# Patient Record
Sex: Female | Born: 1965 | Hispanic: No | Marital: Married | Smoking: Never smoker
Health system: Southern US, Community
[De-identification: ages and names within clinical notes are randomized; demographics above are authoritative.]

## PROBLEM LIST (undated history)

## (undated) DIAGNOSIS — I10 Essential (primary) hypertension: Secondary | ICD-10-CM

## (undated) DIAGNOSIS — N840 Polyp of corpus uteri: Secondary | ICD-10-CM

## (undated) HISTORY — PX: NO PAST SURGERIES: SHX2092

## (undated) HISTORY — PX: WISDOM TOOTH EXTRACTION: SHX21

---

## 2004-08-19 ENCOUNTER — Other Ambulatory Visit: Admission: RE | Admit: 2004-08-19 | Discharge: 2004-08-19 | Payer: Self-pay | Admitting: Addiction Medicine

## 2005-09-06 ENCOUNTER — Other Ambulatory Visit: Admission: RE | Admit: 2005-09-06 | Discharge: 2005-09-06 | Payer: Self-pay | Admitting: Obstetrics and Gynecology

## 2005-09-15 ENCOUNTER — Encounter: Admission: RE | Admit: 2005-09-15 | Discharge: 2005-09-15 | Payer: Self-pay | Admitting: Obstetrics and Gynecology

## 2006-10-31 ENCOUNTER — Other Ambulatory Visit: Admission: RE | Admit: 2006-10-31 | Discharge: 2006-10-31 | Payer: Self-pay | Admitting: Obstetrics and Gynecology

## 2007-06-05 ENCOUNTER — Encounter: Admission: RE | Admit: 2007-06-05 | Discharge: 2007-06-05 | Payer: Self-pay | Admitting: Obstetrics and Gynecology

## 2007-06-14 ENCOUNTER — Encounter: Admission: RE | Admit: 2007-06-14 | Discharge: 2007-06-14 | Payer: Self-pay | Admitting: Obstetrics and Gynecology

## 2007-12-09 ENCOUNTER — Other Ambulatory Visit: Admission: RE | Admit: 2007-12-09 | Discharge: 2007-12-09 | Payer: Self-pay | Admitting: Obstetrics and Gynecology

## 2008-07-22 ENCOUNTER — Encounter: Admission: RE | Admit: 2008-07-22 | Discharge: 2008-07-22 | Payer: Self-pay | Admitting: Obstetrics and Gynecology

## 2009-01-08 ENCOUNTER — Ambulatory Visit: Payer: Self-pay | Admitting: Women's Health

## 2009-01-08 ENCOUNTER — Other Ambulatory Visit: Admission: RE | Admit: 2009-01-08 | Discharge: 2009-01-08 | Payer: Self-pay | Admitting: Addiction Medicine

## 2009-01-08 ENCOUNTER — Encounter: Payer: Self-pay | Admitting: Women's Health

## 2009-08-06 ENCOUNTER — Encounter: Admission: RE | Admit: 2009-08-06 | Discharge: 2009-08-06 | Payer: Self-pay | Admitting: Obstetrics and Gynecology

## 2010-03-31 ENCOUNTER — Ambulatory Visit: Payer: Self-pay | Admitting: Women's Health

## 2010-03-31 ENCOUNTER — Other Ambulatory Visit
Admission: RE | Admit: 2010-03-31 | Discharge: 2010-03-31 | Payer: Self-pay | Source: Home / Self Care | Admitting: Obstetrics and Gynecology

## 2010-05-22 ENCOUNTER — Encounter: Payer: Self-pay | Admitting: Family Medicine

## 2010-07-21 ENCOUNTER — Other Ambulatory Visit: Payer: Self-pay | Admitting: Obstetrics and Gynecology

## 2010-07-21 DIAGNOSIS — Z1231 Encounter for screening mammogram for malignant neoplasm of breast: Secondary | ICD-10-CM

## 2010-08-22 ENCOUNTER — Ambulatory Visit
Admission: RE | Admit: 2010-08-22 | Discharge: 2010-08-22 | Disposition: A | Discharge status: Home / Self Care | Payer: BC Managed Care – PPO | Source: Ambulatory Visit | Attending: Obstetrics and Gynecology | Admitting: Obstetrics and Gynecology

## 2010-08-22 DIAGNOSIS — Z1231 Encounter for screening mammogram for malignant neoplasm of breast: Secondary | ICD-10-CM

## 2011-04-15 ENCOUNTER — Other Ambulatory Visit: Payer: Self-pay | Admitting: Women's Health

## 2011-05-13 ENCOUNTER — Other Ambulatory Visit: Payer: Self-pay | Admitting: Women's Health

## 2011-06-01 ENCOUNTER — Encounter: Payer: BC Managed Care – PPO | Admitting: Women's Health

## 2011-06-14 ENCOUNTER — Other Ambulatory Visit: Payer: Self-pay | Admitting: Women's Health

## 2011-06-22 ENCOUNTER — Encounter: Payer: Self-pay | Admitting: Women's Health

## 2011-06-22 ENCOUNTER — Ambulatory Visit (INDEPENDENT_AMBULATORY_CARE_PROVIDER_SITE_OTHER): Payer: Managed Care, Other (non HMO) | Admitting: Women's Health

## 2011-06-22 VITALS — BP 120/80 | Ht 65.0 in | Wt 180.0 lb

## 2011-06-22 DIAGNOSIS — Z01419 Encounter for gynecological examination (general) (routine) without abnormal findings: Secondary | ICD-10-CM

## 2011-06-22 DIAGNOSIS — Z309 Encounter for contraceptive management, unspecified: Secondary | ICD-10-CM

## 2011-06-22 DIAGNOSIS — IMO0001 Reserved for inherently not codable concepts without codable children: Secondary | ICD-10-CM

## 2011-06-22 DIAGNOSIS — Z13220 Encounter for screening for lipoid disorders: Secondary | ICD-10-CM

## 2011-06-22 LAB — CBC WITH DIFFERENTIAL/PLATELET
Basophils Absolute: 0 10*3/uL (ref 0.0–0.1)
Basophils Relative: 1 % (ref 0–1)
Lymphocytes Relative: 28 % (ref 12–46)
MCV: 93 fL (ref 78.0–100.0)
Monocytes Absolute: 0.4 10*3/uL (ref 0.1–1.0)
Neutro Abs: 4.6 10*3/uL (ref 1.7–7.7)
Neutrophils Relative %: 65 % (ref 43–77)
Platelets: 247 10*3/uL (ref 150–400)
WBC: 7.1 10*3/uL (ref 4.0–10.5)

## 2011-06-22 LAB — LIPID PANEL
Cholesterol: 176 mg/dL (ref 0–200)
HDL: 51 mg/dL (ref >39)
LDL Cholesterol: 96 mg/dL (ref 0–99)
Triglycerides: 147 mg/dL (ref <150)

## 2011-06-22 MED ORDER — NORETHIN ACE-ETH ESTRAD-FE 1-20 MG-MCG PO TABS: 1.0000 | ORAL_TABLET | Freq: Every day | ORAL | Status: DC

## 2011-06-22 NOTE — Progress Notes (Signed)
Melanie Wiggins 1965/06/10 [id number]    History:    The patient presents for annual exam.  Monthly 5 day cycle on tri-Sprintec with complaint of increased dysmenorrhea over the past year or 2. Has occasional night sweats, not daily. History of all normal Paps and mammograms. History of GDM with first pregnancy.  Past medical history, past surgical history, family history and social history were all reviewed and documented in the EPIC chart. Daughter Nehemiah Settle diagnosed with type 1 diabetes last year at age 74, doing well. States has had normal blood sugars when checked at home. 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   ROS:  A  ROS was performed and pertinent positives and negatives are included in the history.  Exam:  Filed Vitals:   06/22/11 0943  BP: 120/80    General appearance:  Normal Head/Neck:  Normal, without cervical or supraclavicular adenopathy. Thyroid:  Symmetrical, normal in size, without palpable masses or nodularity. Respiratory  Effort:  Normal  Auscultation:  Clear without wheezing or rhonchi Cardiovascular  Auscultation:  Regular rate, without rubs, murmurs or gallops  Edema/varicosities:  Not grossly evident Abdominal  Soft,nontender, without masses, guarding or rebound.  Liver/spleen:  No organomegaly noted  Hernia:  None appreciated  Skin  Inspection:  Grossly normal  Palpation:  Grossly normal Neurologic/psychiatric  Orientation:  Normal with appropriate conversation.  Mood/affect:  Normal  Genitourinary    Breasts: Examined lying and sitting.     Right: Without masses, retractions, discharge or axillary adenopathy.     Left: Without masses, retractions, discharge or axillary adenopathy.   Inguinal/mons:  Normal without inguinal adenopathy  External genitalia:  Normal  BUS/Urethra/Skene's glands:  Normal  Bladder:  Normal  Vagina:  Normal  Cervix:  Normal  Uterus:   normal in size, shape and contour.  Midline and mobile  Adnexa/parametria:      Rt: Without masses or tenderness.   Lt: Without masses or tenderness.  Anus and perineum: Normal  Digital rectal exam: Normal sphincter tone without palpated masses or tenderness  Assessment/Plan:  46 y.o. MWF G6P3 for annual exam.     Tri-Sprintec with dysmenorrhea and occasional hot flashes  Plan: Loestrin 1/20 prescription, proper use, slight risk for blood clots and strokes reviewed. Instructed to call if continued problems with dysmenorrhea. Motrin when necessary for dysmenorrhea. SBE's, continue annual mammogram, exercise, calcium rich diet, vitamin D encouraged. CBC, lipid profile, UA. Normal blood glucose at home.   Harrington Challenger [facility], 10:56 AM 06/22/2011

## 2011-06-23 LAB — URINALYSIS W MICROSCOPIC + REFLEX CULTURE
Bacteria, UA: NONE SEEN
Glucose, UA: NEGATIVE mg/dL
Hgb urine dipstick: NEGATIVE
Nitrite: NEGATIVE
Squamous Epithelial / LPF: NONE SEEN
Urobilinogen, UA: 0.2 mg/dL (ref 0.0–1.0)

## 2011-09-18 ENCOUNTER — Telehealth: Payer: Self-pay | Admitting: *Deleted

## 2011-09-18 NOTE — Telephone Encounter (Signed)
Pt is currently taking Loestrin 1/20 since feb 2013 to help with menstrual cramping, before pt was  taking tri-sprintec, but stopped because she started to having cramping with this. Pt is c/o spotting while taking Loestrin, she admits that she does not take pills on time everyday. She would like to switch to something else if possible, pt said she is sick of the spotting every day. Call back # [phone] please advise

## 2011-09-19 NOTE — Telephone Encounter (Signed)
Message left

## 2011-09-20 ENCOUNTER — Other Ambulatory Visit: Payer: Self-pay | Admitting: Women's Health

## 2011-09-20 DIAGNOSIS — IMO0001 Reserved for inherently not codable concepts without codable children: Secondary | ICD-10-CM

## 2011-09-20 MED ORDER — NORGESTREL-ETHINYL ESTRADIOL 0.3-30 MG-MCG PO TABS: 1.0000 | ORAL_TABLET | Freq: Every day | ORAL | Status: DC

## 2011-09-20 NOTE — Progress Notes (Signed)
Telephone call, problems with spotting on Loestrin, having difficulty taking it at the same time each day, did not have this problem on Ortho Tri-Cyclen. Was having increased cramping on  Ortho Tri-Cyclen, that has improved on Loestrin. Currently on placebo week, did take a pregnancy test that was negative. Will try lo ovral, instructed to call if continued spotting reviewed importance of taking daily. Instructed to start Lo/Ovral day 5 of placebo week. Reviewed Ortho Evra patch or nuva ring declines both. Same partner, denies vaginal discharge

## 2011-11-06 ENCOUNTER — Other Ambulatory Visit: Payer: Self-pay | Admitting: Obstetrics and Gynecology

## 2011-11-06 DIAGNOSIS — Z1231 Encounter for screening mammogram for malignant neoplasm of breast: Secondary | ICD-10-CM

## 2011-11-08 ENCOUNTER — Ambulatory Visit
Admission: RE | Admit: 2011-11-08 | Discharge: 2011-11-08 | Disposition: A | Discharge status: Home / Self Care | Payer: Managed Care, Other (non HMO) | Source: Ambulatory Visit | Attending: Obstetrics and Gynecology | Admitting: Obstetrics and Gynecology

## 2011-11-08 DIAGNOSIS — Z1231 Encounter for screening mammogram for malignant neoplasm of breast: Secondary | ICD-10-CM

## 2012-07-27 ENCOUNTER — Other Ambulatory Visit: Payer: Self-pay | Admitting: Women's Health

## 2012-09-16 ENCOUNTER — Encounter: Payer: Self-pay | Admitting: Women's Health

## 2012-09-25 ENCOUNTER — Other Ambulatory Visit: Payer: Self-pay | Admitting: Women's Health

## 2012-10-17 ENCOUNTER — Encounter: Payer: Self-pay | Admitting: Women's Health

## 2012-10-17 ENCOUNTER — Other Ambulatory Visit (HOSPITAL_COMMUNITY)
Admission: RE | Admit: 2012-10-17 | Discharge: 2012-10-17 | Disposition: A | Discharge status: Home / Self Care | Payer: Managed Care, Other (non HMO) | Source: Ambulatory Visit | Attending: Gynecology | Admitting: Gynecology

## 2012-10-17 ENCOUNTER — Ambulatory Visit (INDEPENDENT_AMBULATORY_CARE_PROVIDER_SITE_OTHER): Payer: Managed Care, Other (non HMO) | Admitting: Women's Health

## 2012-10-17 VITALS — BP 116/72 | Ht 65.0 in | Wt 185.0 lb

## 2012-10-17 DIAGNOSIS — Z309 Encounter for contraceptive management, unspecified: Secondary | ICD-10-CM

## 2012-10-17 DIAGNOSIS — Z01419 Encounter for gynecological examination (general) (routine) without abnormal findings: Secondary | ICD-10-CM | POA: Insufficient documentation

## 2012-10-17 DIAGNOSIS — IMO0001 Reserved for inherently not codable concepts without codable children: Secondary | ICD-10-CM

## 2012-10-17 DIAGNOSIS — E079 Disorder of thyroid, unspecified: Secondary | ICD-10-CM

## 2012-10-17 DIAGNOSIS — Z13220 Encounter for screening for lipoid disorders: Secondary | ICD-10-CM

## 2012-10-17 LAB — CBC WITH DIFFERENTIAL/PLATELET
Eosinophils Absolute: 0.1 10*3/uL (ref 0.0–0.7)
HCT: 42 % (ref 36.0–46.0)
Hemoglobin: 14.5 g/dL (ref 12.0–15.0)
MCV: 89.4 fL (ref 78.0–100.0)
Monocytes Absolute: 0.4 10*3/uL (ref 0.1–1.0)
Neutro Abs: 3.4 10*3/uL (ref 1.7–7.7)
RBC: 4.7 MIL/uL (ref 3.87–5.11)
WBC: 6 10*3/uL (ref 4.0–10.5)

## 2012-10-17 MED ORDER — NORGESTREL-ETHINYL ESTRADIOL 0.3-30 MG-MCG PO TABS: 1.0000 | ORAL_TABLET | Freq: Every day | ORAL | Status: DC

## 2012-10-17 NOTE — Progress Notes (Signed)
Melanie Wiggins 1965/11/12 [id number]    History:    The patient presents for annual exam.  Monthly cycle on Cryselle without complaint. History of normal Paps And mammograms.   Past medical history, past surgical history, family history and social history were all reviewed and documented in the EPIC chart. 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 Renato Gails, 23 at [facility], [place] senior in high school doing well. Parents hypertension. Gestational diabetes with one pregnancy.   ROS:  A  ROS was performed and pertinent positives and negatives are included in the history.  Exam:  Filed Vitals:   10/17/12 1430  BP: 116/72    General appearance:  Normal Head/Neck:  Normal, without cervical or supraclavicular adenopathy. Thyroid:  Symmetrical, normal in size, without palpable masses or nodularity. Respiratory  Effort:  Normal  Auscultation:  Clear without wheezing or rhonchi Cardiovascular  Auscultation:  Regular rate, without rubs, murmurs or gallops  Edema/varicosities:  Not grossly evident Abdominal  Soft,nontender, without masses, guarding or rebound.  Liver/spleen:  No organomegaly noted  Hernia:  None appreciated  Skin  Inspection:  Grossly normal  Palpation:  Grossly normal Neurologic/psychiatric  Orientation:  Normal with appropriate conversation.  Mood/affect:  Normal  Genitourinary    Breasts: Examined lying and sitting.     Right: Without masses, retractions, discharge or axillary adenopathy.     Left: Without masses, retractions, discharge or axillary adenopathy.   Inguinal/mons:  Normal without inguinal adenopathy  External genitalia:  Normal  BUS/Urethra/Skene's glands:  Normal  Bladder:  Normal  Vagina:  Normal  Cervix:  Normal  Uterus:   normal in size, shape and contour.  Midline and mobile  Adnexa/parametria:     Rt: Without masses or tenderness.   Lt: Without masses or tenderness.  Anus  and perineum: Normal  Digital rectal exam: Normal sphincter tone without palpated masses or tenderness  Assessment/Plan:  47 y.o. M. WF G6 P3  for annual exam with no complaints.  Normal GYN exam on OC's  Plan: Contraception options reviewed, Mirena IUD, pros condom risks discussed. Handout given. Cryselle prescription, proper use, slight risk for blood clots and strokes reviewed. SBE's, continue annual mammogram, normal history, increase regular exercise and decrease calories for weight loss. CBC, lipid panel, TSH, UA, Pap. Pap normal 2011, new screening guidelines reviewed. Checks blood sugars at home with daughter meter reports all less than 100.    Harrington Challenger WHNP, 3:10 PM 10/17/2012

## 2012-10-17 NOTE — Patient Instructions (Addendum)

## 2012-10-18 LAB — LIPID PANEL
Cholesterol: 188 mg/dL (ref 0–200)
HDL: 43 mg/dL (ref >39)
LDL Cholesterol: 109 mg/dL — ABNORMAL HIGH (ref 0–99)
Total CHOL/HDL Ratio: 4.4 Ratio
Triglycerides: 182 mg/dL — ABNORMAL HIGH (ref <150)
VLDL: 36 mg/dL (ref 0–40)

## 2012-10-18 LAB — URINALYSIS W MICROSCOPIC + REFLEX CULTURE
Glucose, UA: NEGATIVE mg/dL
Hgb urine dipstick: NEGATIVE
Nitrite: NEGATIVE
Specific Gravity, Urine: 1.023 (ref 1.005–1.030)

## 2012-10-21 ENCOUNTER — Encounter: Payer: Self-pay | Admitting: Women's Health

## 2013-02-10 ENCOUNTER — Other Ambulatory Visit: Payer: Self-pay

## 2013-02-10 DIAGNOSIS — Z1231 Encounter for screening mammogram for malignant neoplasm of breast: Secondary | ICD-10-CM

## 2013-02-11 ENCOUNTER — Ambulatory Visit
Admission: RE | Admit: 2013-02-11 | Discharge: 2013-02-11 | Disposition: A | Discharge status: Home / Self Care | Payer: Managed Care, Other (non HMO) | Source: Ambulatory Visit

## 2013-02-11 DIAGNOSIS — Z1231 Encounter for screening mammogram for malignant neoplasm of breast: Secondary | ICD-10-CM

## 2013-10-25 ENCOUNTER — Other Ambulatory Visit: Payer: Self-pay | Admitting: Women's Health

## 2013-11-24 ENCOUNTER — Other Ambulatory Visit: Payer: Self-pay | Admitting: Women's Health

## 2013-12-23 ENCOUNTER — Ambulatory Visit (INDEPENDENT_AMBULATORY_CARE_PROVIDER_SITE_OTHER): Payer: Managed Care, Other (non HMO) | Admitting: Women's Health

## 2013-12-23 ENCOUNTER — Encounter: Payer: Self-pay | Admitting: Women's Health

## 2013-12-23 VITALS — BP 150/88 | Ht 64.5 in | Wt 196.0 lb

## 2013-12-23 DIAGNOSIS — Z01419 Encounter for gynecological examination (general) (routine) without abnormal findings: Secondary | ICD-10-CM

## 2013-12-23 DIAGNOSIS — Z3041 Encounter for surveillance of contraceptive pills: Secondary | ICD-10-CM

## 2013-12-23 LAB — URINALYSIS W MICROSCOPIC + REFLEX CULTURE
Bilirubin Urine: NEGATIVE
CASTS: NONE SEEN
CRYSTALS: NONE SEEN
Glucose, UA: NEGATIVE mg/dL
HGB URINE DIPSTICK: NEGATIVE
Ketones, ur: NEGATIVE mg/dL
NITRITE: NEGATIVE
PROTEIN: NEGATIVE mg/dL
RBC / HPF: NONE SEEN RBC/hpf (ref <3)
SPECIFIC GRAVITY, URINE: 1.02 (ref 1.005–1.030)
UROBILINOGEN UA: 1 mg/dL (ref 0.0–1.0)
pH: 7 (ref 5.0–8.0)

## 2013-12-23 MED ORDER — NORETHIN ACE-ETH ESTRAD-FE 1-20 MG-MCG(24) PO TABS: 1.0000 | ORAL_TABLET | Freq: Every day | ORAL | Status: DC

## 2013-12-23 MED ORDER — NORGESTREL-ETHINYL ESTRADIOL 0.3-30 MG-MCG PO TABS: ORAL_TABLET | ORAL | Status: DC

## 2013-12-23 NOTE — Progress Notes (Signed)
Melanie Wiggins [birth year] [id number]    History:    Presents for annual exam.  Monthly cycle on Cryselle. Struggling with weight. Normal Pap and mammogram history. Gestational diabetes with one pregnancy. Reports being hot all the time.  Past medical history, past surgical history, family history and social history were all reviewed and documented in the EPIC chart. 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 2 sons at [facility] both doing well. Parents hypertension  ROS:  A  12 point ROS was performed and pertinent positives and negatives are included.  Exam:  Filed Vitals:   12/23/13 1432  BP: 150/88    General appearance:  Normal Thyroid:  Symmetrical, normal in size, without palpable masses or nodularity. Respiratory  Auscultation:  Clear without wheezing or rhonchi Cardiovascular  Auscultation:  Regular rate, without rubs, murmurs or gallops  Edema/varicosities:  Not grossly evident Abdominal  Soft,nontender, without masses, guarding or rebound.  Liver/spleen:  No organomegaly noted  Hernia:  None appreciated  Skin  Inspection:  Grossly normal   Breasts: Examined lying and sitting.     Right: Without masses, retractions, discharge or axillary adenopathy.     Left: Without masses, retractions, discharge or axillary adenopathy. Gentitourinary   Inguinal/mons:  Normal without inguinal adenopathy  External genitalia:  Normal  BUS/Urethra/Skene's glands:  Normal  Vagina:  Normal  Cervix:  Normal  Uterus:  normal in size, shape and contour.  Midline and mobile  Adnexa/parametria:     Rt: Without masses or tenderness.   Lt: Without masses or tenderness.  Anus and perineum: Normal  Digital rectal exam: Normal sphincter tone without palpated masses or tenderness  Assessment/Plan:  48 y.o. MWF G6P3 for annual exam.     Blood pressure 150/88 Obesity Monthly cycle on birth control pills  Plan: Contraception options reviewed, will try  Loestrin 1/20 prescription, proper use reviewed slight risk for blood clots and strokes, will check FSH at placebo week. CBC, TSH, , comprehensive metabolic panel, lipid panel will have checked at  place of employment, fax results. Instructed to check blood pressure away from office, was rushing, if remains at over 130/80 discussed with primary care.  SBE's, continue annual mammogram, calcium rich diet, vitamin D 1000 daily, Fish oil supplement encouraged,  history of elevated triglycerides. Discussed importance of decreasing calories and increasing exercise for general health and weight loss. Pap normal 2014, new screening guidelines reviewed.   Note: This dictation was prepared with Dragon/digital dictation.  Any transcriptional errors that result are unintentional. Harrington Challenger [facility], 3:11 PM 12/23/2013

## 2013-12-23 NOTE — Patient Instructions (Signed)

## 2013-12-24 ENCOUNTER — Other Ambulatory Visit: Payer: Self-pay | Admitting: Women's Health

## 2013-12-24 LAB — URINE CULTURE: Colony Count: >=100000

## 2013-12-30 ENCOUNTER — Other Ambulatory Visit: Payer: Self-pay | Admitting: Women's Health

## 2013-12-30 ENCOUNTER — Telehealth: Payer: Self-pay | Admitting: *Deleted

## 2013-12-30 MED ORDER — SULFAMETHOXAZOLE-TRIMETHOPRIM 800-160 MG PO TABS: 1.0000 | ORAL_TABLET | Freq: Two times a day (BID) | ORAL | Status: DC

## 2013-12-30 NOTE — Telephone Encounter (Signed)
Pt was seen on OV 12/23/13 declined Rx for UTI at the time of visit. Pt now c/o symptoms burning with urination and urgency. Pt would like Rx now. Please advise

## 2013-12-30 NOTE — Telephone Encounter (Signed)
Left message, urine multiple organisms greater than 100,000, will call in Septra twice daily for 3 days. Instructed to call if any questions or if symptoms do not resolve. 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

## 2013-12-31 NOTE — Telephone Encounter (Signed)
Telephone call, states receive message and started on medication yesterday, today symptoms are better.

## 2014-01-01 ENCOUNTER — Other Ambulatory Visit: Payer: Self-pay | Admitting: Women's Health

## 2014-01-01 LAB — LIPID PANEL
CHOLESTEROL: 164 mg/dL (ref 0–200)
HDL: 38 mg/dL — ABNORMAL LOW (ref >39)
LDL Cholesterol: 70 mg/dL (ref 0–99)
Total CHOL/HDL Ratio: 4.3 Ratio
Triglycerides: 281 mg/dL — ABNORMAL HIGH (ref <150)
VLDL: 56 mg/dL — AB (ref 0–40)

## 2014-01-01 LAB — CBC
HCT: 40.8 % (ref 36.0–46.0)
Hemoglobin: 13.9 g/dL (ref 12.0–15.0)
MCH: 30.7 pg (ref 26.0–34.0)
MCHC: 34.1 g/dL (ref 30.0–36.0)
MCV: 90.1 fL (ref 78.0–100.0)
Platelets: 256 10*3/uL (ref 150–400)
RBC: 4.53 MIL/uL (ref 3.87–5.11)
RDW: 13.2 % (ref 11.5–15.5)
WBC: 7.2 10*3/uL (ref 4.0–10.5)

## 2014-01-01 LAB — COMPREHENSIVE METABOLIC PANEL
ALK PHOS: 57 U/L (ref 39–117)
ALT: 10 U/L (ref 0–35)
AST: 14 U/L (ref 0–37)
Albumin: 4.2 g/dL (ref 3.5–5.2)
BILIRUBIN TOTAL: 0.3 mg/dL (ref 0.2–1.2)
BUN: 12 mg/dL (ref 6–23)
CALCIUM: 9.2 mg/dL (ref 8.4–10.5)
CHLORIDE: 104 meq/L (ref 96–112)
CO2: 24 meq/L (ref 19–32)
CREATININE: 0.99 mg/dL (ref 0.50–1.10)
GLUCOSE: 91 mg/dL (ref 70–99)
Potassium: 4.1 mEq/L (ref 3.5–5.3)
SODIUM: 136 meq/L (ref 135–145)
Total Protein: 6.6 g/dL (ref 6.0–8.3)

## 2014-01-01 LAB — FOLLICLE STIMULATING HORMONE: FSH: 3.1 m[IU]/mL

## 2014-01-01 LAB — TSH: TSH: 1.487 u[IU]/mL (ref 0.350–4.500)

## 2014-01-02 ENCOUNTER — Telehealth: Payer: Self-pay

## 2014-01-02 ENCOUNTER — Other Ambulatory Visit: Payer: Self-pay | Admitting: Women's Health

## 2014-01-02 DIAGNOSIS — E781 Pure hyperglyceridemia: Secondary | ICD-10-CM

## 2014-01-02 NOTE — Telephone Encounter (Signed)
Patient advised.

## 2014-01-02 NOTE — Telephone Encounter (Signed)
Yes that will impact results, but still do not think she is menopausal. Still having monthly cycles.

## 2014-01-02 NOTE — Telephone Encounter (Signed)
Patient wanted you to know that she had actually started her birth control pills and was taking them when [facility] level drawn. She wondered how much that would impact result?

## 2014-01-02 NOTE — Telephone Encounter (Signed)
Message copied by Keenan Bachelor on Fri Jan 02, 2014 12:26 PM ------      Message from: [place], [place] J      Created: Thu Jan 01, 2014  6:03 PM       Please call and her  review TSH, normal FSH low not menopausal, (not why she is hot often) blood sugar normal. Cholesterol overall number okay but  Triglycerides are twice what they should be, HDL is low. Best way to improve regular exercise 30 minutes most days of the week, fish oil supplement twice daily, less than 20 g saturated fat daily, loose 5 pounds. Have her continue to check B/P, elevated at OV, recheck fasting lipid panel in 6 months.  (She is a CMA) ------

## 2014-03-02 ENCOUNTER — Encounter: Payer: Self-pay | Admitting: Women's Health

## 2014-05-14 ENCOUNTER — Telehealth: Payer: Self-pay | Admitting: *Deleted

## 2014-05-14 NOTE — Telephone Encounter (Signed)
No to phentermine, wt watchers best or dietician.  Risks for phentermine increased B/P

## 2014-05-14 NOTE — Telephone Encounter (Signed)
Pt called to see if you would be willing to give Rx for phentermine to help with weight loss? Should pt see a PCP for this?

## 2014-05-15 NOTE — Telephone Encounter (Signed)
Left the below on pt cell voicemail.

## 2014-08-03 ENCOUNTER — Other Ambulatory Visit: Payer: Self-pay

## 2014-08-03 DIAGNOSIS — Z1231 Encounter for screening mammogram for malignant neoplasm of breast: Secondary | ICD-10-CM

## 2014-08-10 ENCOUNTER — Ambulatory Visit
Admission: RE | Admit: 2014-08-10 | Discharge: 2014-08-10 | Disposition: A | Discharge status: Home / Self Care | Payer: Managed Care, Other (non HMO) | Source: Ambulatory Visit

## 2014-08-10 DIAGNOSIS — Z1231 Encounter for screening mammogram for malignant neoplasm of breast: Secondary | ICD-10-CM

## 2014-08-11 ENCOUNTER — Other Ambulatory Visit: Payer: Self-pay | Admitting: Women's Health

## 2014-08-11 DIAGNOSIS — R928 Other abnormal and inconclusive findings on diagnostic imaging of breast: Secondary | ICD-10-CM

## 2014-08-13 ENCOUNTER — Ambulatory Visit
Admission: RE | Admit: 2014-08-13 | Discharge: 2014-08-13 | Disposition: A | Discharge status: Home / Self Care | Payer: Managed Care, Other (non HMO) | Source: Ambulatory Visit | Attending: Women's Health | Admitting: Women's Health

## 2014-08-13 DIAGNOSIS — R928 Other abnormal and inconclusive findings on diagnostic imaging of breast: Secondary | ICD-10-CM

## 2015-02-08 ENCOUNTER — Other Ambulatory Visit: Payer: Self-pay

## 2015-02-08 DIAGNOSIS — Z3041 Encounter for surveillance of contraceptive pills: Secondary | ICD-10-CM

## 2015-06-22 ENCOUNTER — Other Ambulatory Visit: Payer: Self-pay

## 2015-06-22 DIAGNOSIS — Z1231 Encounter for screening mammogram for malignant neoplasm of breast: Secondary | ICD-10-CM

## 2015-08-11 ENCOUNTER — Ambulatory Visit: Payer: Managed Care, Other (non HMO)

## 2015-08-20 ENCOUNTER — Ambulatory Visit
Admission: RE | Admit: 2015-08-20 | Discharge: 2015-08-20 | Disposition: A | Discharge status: Home / Self Care | Payer: Managed Care, Other (non HMO) | Source: Ambulatory Visit

## 2015-08-20 DIAGNOSIS — Z1231 Encounter for screening mammogram for malignant neoplasm of breast: Secondary | ICD-10-CM

## 2016-02-02 ENCOUNTER — Encounter: Payer: Self-pay | Admitting: Women's Health

## 2016-02-02 ENCOUNTER — Ambulatory Visit (INDEPENDENT_AMBULATORY_CARE_PROVIDER_SITE_OTHER): Payer: Managed Care, Other (non HMO) | Admitting: Women's Health

## 2016-02-02 VITALS — BP 128/80 | Ht 64.0 in | Wt 186.0 lb

## 2016-02-02 DIAGNOSIS — N938 Other specified abnormal uterine and vaginal bleeding: Secondary | ICD-10-CM

## 2016-02-02 DIAGNOSIS — Z23 Encounter for immunization: Secondary | ICD-10-CM | POA: Diagnosis not present

## 2016-02-02 LAB — URINALYSIS W MICROSCOPIC + REFLEX CULTURE
Bilirubin Urine: NEGATIVE
Casts: NONE SEEN [LPF]
Crystals: NONE SEEN [HPF]
Glucose, UA: NEGATIVE
Leukocytes, UA: NEGATIVE
Nitrite: NEGATIVE
Protein, ur: NEGATIVE
SPECIFIC GRAVITY, URINE: 1.024 (ref 1.001–1.035)
YEAST: NONE SEEN [HPF]
pH: 6 (ref 5.0–8.0)

## 2016-02-02 NOTE — Patient Instructions (Signed)

## 2016-02-02 NOTE — Progress Notes (Signed)
Presents with complaint of irregular spotting on pills for the past week. Did take 1 pill late currently in second week of OCs. Also under increased stress mother in the hospital with ruptured diverticula with fistula. Was last seen here in 2015 normal Pap and mammogram history. 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 Parents both hypertensive. Monthly cycles with no spotting prior. Questions if she needs to change birth control pills. Occasional hot flushes. Married, good relationship denies need for STD screening.  Exam: Appears well, slightly anxious. Abdomen soft nontender. External genitalia within normal limits, speculum exam moderate amount of menses type blood noted, no polyp, odor, erythema noted.  DUB/Irregular spotting on OCs 1 week Situational stress Hypertension  Plan: Instructed to double up on pills today and tomorrow. Schedule annual exam and will check labs, reviewed possibly late pill and situational stress causing spotting. If this persists we'll proceed to sonohysterogram with Dr. Audie BoxFontaine.

## 2016-02-05 LAB — URINE CULTURE

## 2016-02-07 ENCOUNTER — Telehealth: Payer: Self-pay | Admitting: *Deleted

## 2016-02-07 NOTE — Telephone Encounter (Signed)
It is the first occurrence and only one week, extreme stress mother in hosp with ruptured diverticuli, best to watch at this time. Tell her we hope her mom is doing better.

## 2016-02-07 NOTE — Telephone Encounter (Signed)
Pt called to follow up from office visit on 02/02/16 states she has doubled up on pills as directed and no change with irregular bleeding, pt asked if she should wait a little longer? Or [facility]

## 2016-02-07 NOTE — Telephone Encounter (Signed)
Pt informed with the below note. Will watch for now and follow up if needed.

## 2016-02-10 ENCOUNTER — Telehealth: Payer: Self-pay | Admitting: *Deleted

## 2016-02-10 DIAGNOSIS — N926 Irregular menstruation, unspecified: Secondary | ICD-10-CM

## 2016-02-10 MED ORDER — MEGESTROL ACETATE 40 MG PO TABS: 40.0000 mg | ORAL_TABLET | Freq: Two times a day (BID) | ORAL | 1 refills | Status: DC

## 2016-02-10 NOTE — Telephone Encounter (Signed)
Proceed to sonohysterogram with Dr. Audie BoxFontaine, Megace 40 mg twice a day until scheduled, review cannot be bleeding with test. Instructed to call if no relief of bleeding while on Megace, continue ocs and skip placebo wk.

## 2016-02-10 NOTE — Telephone Encounter (Signed)
Pt informed, will call to schedule [facility] when bleeding stops. Rx sent.

## 2016-02-10 NOTE — Telephone Encounter (Signed)
Pt states her irregular bleeding is not any better, no worse either still the same. Patient say this is day 14 of bleeding.  Pt doesn't believe its stress related, asked if labs should be drawn? Proceed with [facility], takes pills on time daily. Pt is concerned about this as this is new. Please advise

## 2016-02-25 ENCOUNTER — Encounter: Payer: Self-pay | Admitting: Women's Health

## 2016-02-25 ENCOUNTER — Ambulatory Visit (INDEPENDENT_AMBULATORY_CARE_PROVIDER_SITE_OTHER): Payer: Managed Care, Other (non HMO) | Admitting: Women's Health

## 2016-02-25 VITALS — BP 126/80 | Ht 64.0 in | Wt 185.0 lb

## 2016-02-25 DIAGNOSIS — Z13220 Encounter for screening for lipoid disorders: Secondary | ICD-10-CM

## 2016-02-25 DIAGNOSIS — N938 Other specified abnormal uterine and vaginal bleeding: Secondary | ICD-10-CM | POA: Insufficient documentation

## 2016-02-25 DIAGNOSIS — Z3041 Encounter for surveillance of contraceptive pills: Secondary | ICD-10-CM

## 2016-02-25 DIAGNOSIS — Z1329 Encounter for screening for other suspected endocrine disorder: Secondary | ICD-10-CM

## 2016-02-25 DIAGNOSIS — Z113 Encounter for screening for infections with a predominantly sexual mode of transmission: Secondary | ICD-10-CM

## 2016-02-25 DIAGNOSIS — Z01419 Encounter for gynecological examination (general) (routine) without abnormal findings: Secondary | ICD-10-CM | POA: Diagnosis not present

## 2016-02-25 LAB — TSH: TSH: 1.65 m[IU]/L

## 2016-02-25 LAB — CBC WITH DIFFERENTIAL/PLATELET
Basophils Absolute: 73 cells/uL (ref 0–200)
Basophils Relative: 1 %
Eosinophils Absolute: 73 cells/uL (ref 15–500)
Eosinophils Relative: 1 %
HEMATOCRIT: 43.6 % (ref 35.0–45.0)
Hemoglobin: 14.7 g/dL (ref 11.7–15.5)
LYMPHS PCT: 31 %
Lymphs Abs: 2263 cells/uL (ref 850–3900)
MCH: 31 pg (ref 27.0–33.0)
MCHC: 33.7 g/dL (ref 32.0–36.0)
MCV: 92 fL (ref 80.0–100.0)
MPV: 10.7 fL (ref 7.5–12.5)
Monocytes Absolute: 365 cells/uL (ref 200–950)
Monocytes Relative: 5 %
NEUTROS PCT: 62 %
Neutro Abs: 4526 cells/uL (ref 1500–7800)
PLATELETS: 287 10*3/uL (ref 140–400)
RBC: 4.74 MIL/uL (ref 3.80–5.10)
RDW: 13 % (ref 11.0–15.0)
WBC: 7.3 10*3/uL (ref 3.8–10.8)

## 2016-02-25 MED ORDER — NORETHIN ACE-ETH ESTRAD-FE 1-20 MG-MCG(24) PO TABS: 1.0000 | ORAL_TABLET | Freq: Every day | ORAL | 4 refills | Status: DC

## 2016-02-25 NOTE — Addendum Note (Signed)
Addended by: Kem ParkinsonBARNES, Antwaine Boomhower on: 02/25/2016 03:24 PM   Modules accepted: Orders

## 2016-02-25 NOTE — Progress Notes (Signed)
Melanie SierrasStacy Wiggins 12-12-1965 [id number]    History:    Presents for annual exam.  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 Regular monthly cycle on Loestrin until October, prolonged bleeding for weeks, Megace 02/12/2016 did stop bleeding.  First month of irregular bleeding. Normal Pap and mammogram history. Hypertension stable on Norvasc for one year per primary care.    Past medical history, past surgical history, family history and social history were all reviewed and documented in the EPIC chart. 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 [facility]. Parents hypertension.  ROS:  A ROS was performed and pertinent positives and negatives are included.  Exam:  Vitals:   02/25/16 1409  BP: 126/80  Weight: 185 lb (83.9 kg)  Height: 5\' 4"  (1.626 m)   Body mass index is 31.76 kg/m.   General appearance:  Normal Thyroid:  Symmetrical, normal in size, without palpable masses or nodularity. Respiratory  Auscultation:  Clear without wheezing or rhonchi Cardiovascular  Auscultation:  Regular rate, without rubs, murmurs or gallops  Edema/varicosities:  Not grossly evident Abdominal  Soft,nontender, without masses, guarding or rebound.  Liver/spleen:  No organomegaly noted  Hernia:  None appreciated  Skin  Inspection:  Grossly normal   Breasts: Examined lying and sitting.     Right: Without masses, retractions, discharge or axillary adenopathy.     Left: Without masses, retractions, discharge or axillary adenopathy. Gentitourinary   Inguinal/mons:  Normal without inguinal adenopathy  External genitalia:  Normal  BUS/Urethra/Skene's glands:  Normal  Vagina:  Normal  Cervix:  Normal  Uterus:   normal in size, shape and contour.  Midline and mobile  Adnexa/parametria:     Rt: Without masses or tenderness.   Lt: Without masses or tenderness.  Anus and perineum: Normal  Digital rectal  exam: Normal sphincter tone without palpated masses or tenderness  Assessment/Plan:  50 y.o. MWF G3 P3  for annual exam.     Monthly cycle on Loestrin until October-DUB 1 month Hypertension-primary care manages  Plan: Loestrin/1/20 prescription, proper use, slight risk for blood clots and strokes reviewed. Reviewed risks with hypertension. Reviewed if irregular bleeding next month sonohysterogram with Dr. Audie BoxFontaine. Agreeable, instructed to call if cycle >7days . SBE's, annual screening mammogram, calcium rich diet, vitamin D 1000 daily and regular exercise encouraged. Process of losing weight has lost 15 pounds in the past few months with healthy diet and exercise. CBC, CMP, lipid panel, TSH, UA, Pap with HR HPV typing, new screening guidelines reviewed. GC/Chlamydia added to Pap. Declined need for STD screening    Harrington ChallengerYOUNG,Zeynep Fantroy J WHNP, 3:00 PM 02/25/2016

## 2016-02-25 NOTE — Patient Instructions (Signed)
Dysfunctional Uterine Bleeding Dysfunctional uterine bleeding is abnormal bleeding from the uterus. Dysfunctional uterine bleeding includes:  A period that comes earlier or later than usual.  A period that is lighter, heavier, or has blood clots.  Bleeding between periods.  Skipping one or more periods.  Bleeding after sexual intercourse.  Bleeding after menopause. HOME CARE INSTRUCTIONS  Pay attention to any changes in your symptoms. Follow these instructions to help with your condition: Eating  Eat well-balanced meals. Include foods that are high in iron, such as liver, meat, shellfish, green leafy vegetables, and eggs.  If you become constipated:  Drink plenty of water.  Eat fruits and vegetables that are high in water and fiber, such as spinach, carrots, raspberries, apples, and mango. Medicines  Take over-the-counter and prescription medicines only as told by your health care provider.  Do not change medicines without talking with your health care provider.  Aspirin or medicines that contain aspirin may make the bleeding worse. Do not take those medicines:  During the week before your period.  During your period.  If you were prescribed iron pills, take them as told by your health care provider. Iron pills help to replace iron that your body loses because of this condition. Activity  If you need to change your sanitary pad or tampon more than one time every 2 hours:  Lie in bed with your feet raised (elevated).  Place a cold pack on your lower abdomen.  Rest as much as possible until the bleeding stops or slows down.  Do not try to lose weight until the bleeding has stopped and your blood iron level is back to normal. Other Instructions  For two months, write down:  When your period starts.  When your period ends.  When any abnormal bleeding occurs.  What problems you notice.  Keep all follow up visits as told by your health care provider. This is  important. SEEK MEDICAL CARE IF:  You get light-headed or weak.  You have nausea and vomiting.  You cannot eat or drink without vomiting.  You feel dizzy or have diarrhea while you are taking medicines.  You are taking birth control pills or hormones, and you want to change them or stop taking them. SEEK IMMEDIATE MEDICAL CARE IF:  You develop a fever or chills.  You need to change your sanitary pad or tampon more than one time per hour.  Your bleeding becomes heavier, or your flow contains clots more often.  You develop pain in your abdomen.  You lose consciousness.  You develop a rash.   This information is not intended to replace advice given to you by your health care provider. Make sure you discuss any questions you have with your health care provider.   Document Released: 04/14/2000 Document Revised: 01/06/2015 Document Reviewed: 07/13/2014 Elsevier Interactive Patient Education 2016 [place] Maintenance, Female Adopting a healthy lifestyle and getting preventive care can go a long way to promote health and wellness. Talk with your health care provider about what schedule of regular examinations is right for you. This is a good chance for you to check in with your provider about disease prevention and staying healthy. In between checkups, there are plenty of things you can do on your own. Experts have done a lot of research about which lifestyle changes and preventive measures are most likely to keep you healthy. Ask your health care provider for more information. WEIGHT AND DIET  Eat a healthy diet  Be sure  to include plenty of vegetables, fruits, low-fat dairy products, and lean protein.  Do not eat a lot of foods high in solid fats, added sugars, or salt.  Get regular exercise. This is one of the most important things you can do for your health.  Most adults should exercise for at least 150 minutes each week. The exercise should increase your heart rate  and make you sweat (moderate-intensity exercise).  Most adults should also do strengthening exercises at least twice a week. This is in addition to the moderate-intensity exercise.  Maintain a healthy weight  Body mass index (BMI) is a measurement that can be used to identify possible weight problems. It estimates body fat based on height and weight. Your health care provider can help determine your BMI and help you achieve or maintain a healthy weight.  For females 62 years of age and older:   A BMI below 18.5 is considered underweight.  A BMI of 18.5 to 24.9 is normal.  A BMI of 25 to 29.9 is considered overweight.  A BMI of 30 and above is considered obese.  Watch levels of cholesterol and blood lipids  You should start having your blood tested for lipids and cholesterol at 49 years of age, then have this test every 5 years.  You may need to have your cholesterol levels checked more often if:  Your lipid or cholesterol levels are high.  You are older than 49 years of age.  You are at high risk for heart disease.  CANCER SCREENING   Lung Cancer  Lung cancer screening is recommended for adults 15-69 years old who are at high risk for lung cancer because of a history of smoking.  A yearly low-dose CT scan of the lungs is recommended for people who:  Currently smoke.  Have quit within the past 15 years.  Have at least a 30-pack-year history of smoking. A pack year is smoking an average of one pack of cigarettes a day for 1 year.  Yearly screening should continue until it has been 15 years since you quit.  Yearly screening should stop if you develop a health problem that would prevent you from having lung cancer treatment.  Breast Cancer  Practice breast self-awareness. This means understanding how your breasts normally appear and feel.  It also means doing regular breast self-exams. Let your health care provider know about any changes, no matter how small.  If  you are in your 20s or 30s, you should have a clinical breast exam (CBE) by a health care provider every 1-3 years as part of a regular health exam.  If you are 25 or older, have a CBE every year. Also consider having a breast X-ray (mammogram) every year.  If you have a family history of breast cancer, talk to your health care provider about genetic screening.  If you are at high risk for breast cancer, talk to your health care provider about having an MRI and a mammogram every year.  Breast cancer gene (BRCA) assessment is recommended for women who have family members with BRCA-related cancers. BRCA-related cancers include:  Breast.  Ovarian.  Tubal.  Peritoneal cancers.  Results of the assessment will determine the need for genetic counseling and BRCA1 and BRCA2 testing. Cervical Cancer Your health care provider may recommend that you be screened regularly for cancer of the pelvic organs (ovaries, uterus, and vagina). This screening involves a pelvic examination, including checking for microscopic changes to the surface of your cervix (Pap  test). You may be encouraged to have this screening done every 3 years, beginning at age 59.  For women ages 1-65, health care providers may recommend pelvic exams and Pap testing every 3 years, or they may recommend the Pap and pelvic exam, combined with testing for human papilloma virus (HPV), every 5 years. Some types of HPV increase your risk of cervical cancer. Testing for HPV may also be done on women of any age with unclear Pap test results.  Other health care providers may not recommend any screening for nonpregnant women who are considered low risk for pelvic cancer and who do not have symptoms. Ask your health care provider if a screening pelvic exam is right for you.  If you have had past treatment for cervical cancer or a condition that could lead to cancer, you need Pap tests and screening for cancer for at least 20 years after your  treatment. If Pap tests have been discontinued, your risk factors (such as having a new sexual partner) need to be reassessed to determine if screening should resume. Some women have medical problems that increase the chance of getting cervical cancer. In these cases, your health care provider may recommend more frequent screening and Pap tests. Colorectal Cancer  This type of cancer can be detected and often prevented.  Routine colorectal cancer screening usually begins at 49 years of age and continues through 49 years of age.  Your health care provider may recommend screening at an earlier age if you have risk factors for colon cancer.  Your health care provider may also recommend using home test kits to check for hidden blood in the stool.  A small camera at the end of a tube can be used to examine your colon directly (sigmoidoscopy or colonoscopy). This is done to check for the earliest forms of colorectal cancer.  Routine screening usually begins at age 52.  Direct examination of the colon should be repeated every 5-10 years through 49 years of age. However, you may need to be screened more often if early forms of precancerous polyps or small growths are found. Skin Cancer  Check your skin from head to toe regularly.  Tell your health care provider about any new moles or changes in moles, especially if there is a change in a mole's shape or color.  Also tell your health care provider if you have a mole that is larger than the size of a pencil eraser.  Always use sunscreen. Apply sunscreen liberally and repeatedly throughout the day.  Protect yourself by wearing long sleeves, pants, a wide-brimmed hat, and sunglasses whenever you are outside. HEART DISEASE, DIABETES, AND HIGH BLOOD PRESSURE   High blood pressure causes heart disease and increases the risk of stroke. High blood pressure is more likely to develop in:  People who have blood pressure in the high end of the normal range  (130-139/85-89 mm Hg).  People who are overweight or obese.  People who are African American.  If you are 3-54 years of age, have your blood pressure checked every 3-5 years. If you are 52 years of age or older, have your blood pressure checked every year. You should have your blood pressure measured twice--once when you are at a hospital or clinic, and once when you are not at a hospital or clinic. Record the average of the two measurements. To check your blood pressure when you are not at a hospital or clinic, you can use:  An automated blood pressure machine  at a pharmacy.  A home blood pressure monitor.  If you are between 24 years and 38 years old, ask your health care provider if you should take aspirin to prevent strokes.  Have regular diabetes screenings. This involves taking a blood sample to check your fasting blood sugar level.  If you are at a normal weight and have a low risk for diabetes, have this test once every three years after 49 years of age.  If you are overweight and have a high risk for diabetes, consider being tested at a younger age or more often. PREVENTING INFECTION  Hepatitis B  If you have a higher risk for hepatitis B, you should be screened for this virus. You are considered at high risk for hepatitis B if:  You were born in a country where hepatitis B is common. Ask your health care provider which countries are considered high risk.  Your parents were born in a high-risk country, and you have not been immunized against hepatitis B (hepatitis B vaccine).  You have HIV or AIDS.  You use needles to inject street drugs.  You live with someone who has hepatitis B.  You have had sex with someone who has hepatitis B.  You get hemodialysis treatment.  You take certain medicines for conditions, including cancer, organ transplantation, and autoimmune conditions. Hepatitis C  Blood testing is recommended for:  Everyone born from [birth year] through  1964.  Anyone with known risk factors for hepatitis C. Sexually transmitted infections (STIs)  You should be screened for sexually transmitted infections (STIs) including gonorrhea and chlamydia if:  You are sexually active and are younger than 49 years of age.  You are older than 49 years of age and your health care provider tells you that you are at risk for this type of infection.  Your sexual activity has changed since you were last screened and you are at an increased risk for chlamydia or gonorrhea. Ask your health care provider if you are at risk.  If you do not have HIV, but are at risk, it may be recommended that you take a prescription medicine daily to prevent HIV infection. This is called pre-exposure prophylaxis (PrEP). You are considered at risk if:  You are sexually active and do not regularly use condoms or know the HIV status of your partner(s).  You take drugs by injection.  You are sexually active with a partner who has HIV. Talk with your health care provider about whether you are at high risk of being infected with HIV. If you choose to begin PrEP, you should first be tested for HIV. You should then be tested every 3 months for as long as you are taking PrEP.  PREGNANCY   If you are premenopausal and you may become pregnant, ask your health care provider about preconception counseling.  If you may become pregnant, take 400 to 800 micrograms (mcg) of folic acid every day.  If you want to prevent pregnancy, talk to your health care provider about birth control (contraception). OSTEOPOROSIS AND MENOPAUSE   Osteoporosis is a disease in which the bones lose minerals and strength with aging. This can result in serious bone fractures. Your risk for osteoporosis can be identified using a bone density scan.  If you are 65 years of age or older, or if you are at risk for osteoporosis and fractures, ask your health care provider if you should be screened.  Ask your health  care provider whether you should take a  calcium or vitamin D supplement to lower your risk for osteoporosis.  Menopause may have certain physical symptoms and risks.  Hormone replacement therapy may reduce some of these symptoms and risks. Talk to your health care provider about whether hormone replacement therapy is right for you.  HOME CARE INSTRUCTIONS   Schedule regular health, dental, and eye exams.  Stay current with your immunizations.   Do not use any tobacco products including cigarettes, chewing tobacco, or electronic cigarettes.  If you are pregnant, do not drink alcohol.  If you are breastfeeding, limit how much and how often you drink alcohol.  Limit alcohol intake to no more than 1 drink per day for nonpregnant women. One drink equals 12 ounces of beer, 5 ounces of wine, or 1 ounces of hard liquor.  Do not use street drugs.  Do not share needles.  Ask your health care provider for help if you need support or information about quitting drugs.  Tell your health care provider if you often feel depressed.  Tell your health care provider if you have ever been abused or do not feel safe at home.   This information is not intended to replace advice given to you by your health care provider. Make sure you discuss any questions you have with your health care provider.   Document Released: 10/31/2010 Document Revised: 05/08/2014 Document Reviewed: 03/19/2013 Elsevier Interactive Patient Education [employer].

## 2016-02-25 NOTE — Addendum Note (Signed)
Addended by: Kem ParkinsonBARNES, Terrilee Dudzik on: 02/25/2016 04:06 PM   Modules accepted: Orders

## 2016-02-25 NOTE — Addendum Note (Signed)
Addended by: Kem ParkinsonBARNES, Triniti Gruetzmacher on: 02/25/2016 03:32 PM   Modules accepted: Orders

## 2016-02-25 NOTE — Addendum Note (Signed)
Addended by: Kem ParkinsonBARNES, Trygve Thal on: 02/25/2016 04:39 PM   Modules accepted: Orders

## 2016-02-26 LAB — LIPID PANEL
CHOL/HDL RATIO: 5.3 ratio — AB (ref <=5.0)
Cholesterol: 185 mg/dL (ref 125–200)
HDL: 35 mg/dL — AB (ref >=46)
LDL CALC: 103 mg/dL (ref <130)
Triglycerides: 235 mg/dL — ABNORMAL HIGH (ref <150)
VLDL: 47 mg/dL — ABNORMAL HIGH (ref <30)

## 2016-02-26 LAB — COMPREHENSIVE METABOLIC PANEL
ALK PHOS: 41 U/L (ref 33–115)
ALT: 13 U/L (ref 6–29)
AST: 18 U/L (ref 10–35)
Albumin: 4.2 g/dL (ref 3.6–5.1)
BUN: 13 mg/dL (ref 7–25)
CALCIUM: 9.7 mg/dL (ref 8.6–10.2)
CO2: 19 mmol/L — AB (ref 20–31)
Chloride: 104 mmol/L (ref 98–110)
Creat: 0.84 mg/dL (ref 0.50–1.10)
GLUCOSE: 91 mg/dL (ref 65–99)
Potassium: 3.9 mmol/L (ref 3.5–5.3)
Sodium: 138 mmol/L (ref 135–146)
Total Bilirubin: 0.3 mg/dL (ref 0.2–1.2)
Total Protein: 7.1 g/dL (ref 6.1–8.1)

## 2016-02-28 ENCOUNTER — Other Ambulatory Visit: Payer: Self-pay | Admitting: *Deleted

## 2016-02-28 ENCOUNTER — Telehealth: Payer: Self-pay | Admitting: *Deleted

## 2016-02-28 DIAGNOSIS — E7800 Pure hypercholesterolemia, unspecified: Secondary | ICD-10-CM

## 2016-02-28 NOTE — Telephone Encounter (Signed)
I called pt about about her labs results and pt asked if hormone levels should be drawn due to irregular bleeding she had noted on 02/25/16.  Please advise

## 2016-02-28 NOTE — Telephone Encounter (Signed)
Inform TSH normal, and if continued irreg bleeding this mo SH[facility] with Dr Audie BoxFontaine, first mo of irreg bleeding.

## 2016-02-28 NOTE — Telephone Encounter (Signed)
Left message for pt to call.

## 2016-02-28 NOTE — Telephone Encounter (Signed)
Pt informed with the below note,  

## 2016-02-29 LAB — PAP, TP IMAGING W/ HPV RNA, RFLX HPV TYPE 16,18/45: HPV MRNA, HIGH RISK: NOT DETECTED

## 2016-03-03 ENCOUNTER — Encounter: Payer: Managed Care, Other (non HMO) | Admitting: Women's Health

## 2016-07-10 ENCOUNTER — Other Ambulatory Visit: Payer: Self-pay | Admitting: Gynecology

## 2016-07-10 DIAGNOSIS — N939 Abnormal uterine and vaginal bleeding, unspecified: Secondary | ICD-10-CM

## 2016-07-19 ENCOUNTER — Ambulatory Visit (INDEPENDENT_AMBULATORY_CARE_PROVIDER_SITE_OTHER): Payer: Managed Care, Other (non HMO) | Admitting: Gynecology

## 2016-07-19 ENCOUNTER — Ambulatory Visit (INDEPENDENT_AMBULATORY_CARE_PROVIDER_SITE_OTHER): Payer: Managed Care, Other (non HMO)

## 2016-07-19 ENCOUNTER — Other Ambulatory Visit: Payer: Self-pay | Admitting: Gynecology

## 2016-07-19 VITALS — BP 130/88

## 2016-07-19 DIAGNOSIS — N926 Irregular menstruation, unspecified: Secondary | ICD-10-CM

## 2016-07-19 DIAGNOSIS — N939 Abnormal uterine and vaginal bleeding, unspecified: Secondary | ICD-10-CM

## 2016-07-19 DIAGNOSIS — R938 Abnormal findings on diagnostic imaging of other specified body structures: Secondary | ICD-10-CM | POA: Diagnosis not present

## 2016-07-19 DIAGNOSIS — N840 Polyp of corpus uteri: Secondary | ICD-10-CM

## 2016-07-19 DIAGNOSIS — R9389 Abnormal findings on diagnostic imaging of other specified body structures: Secondary | ICD-10-CM

## 2016-07-19 NOTE — Patient Instructions (Signed)
Office will call you with the biopsy results.  Office will call you to schedule the hysteroscopy D&C.

## 2016-07-19 NOTE — Progress Notes (Signed)
    Melanie SierrasStacy Wiggins July 29, 1965 [id number]        51 y.o.  [id number] presents for sonohysterogram due to history of irregular bleeding starting in October 2017. Patient was having breakthrough bleeding on Loestrin 1/20 equivalent pill. Had doubled up on her pills initially ultimately took Megace to control the bleeding. Transiently seemed to be better but then had breakthrough bleeding during the month of January. No breakthrough bleeding in February.  Past medical history,surgical history, problem list, medications, allergies, family history and social history were all reviewed and documented in the EPIC chart.  Directed ROS with pertinent positives and negatives documented in the history of present illness/assessment and plan.  Exam: [place] assistant General appearance:  Normal Abdomen soft nontender without masses guarding rebound Pelvic external BUS vagina normal. Cervix normal. Uterus normal size midline mobile nontender. Adnexa without masses or tenderness.  Ultrasound:  Transabdominal and transvaginal shows uterus normal size and echotexture. Prominent endometrial focus 11 x 8 mm noted with flow. Endometrial echo 7.6 mm. Right and left ovaries normal. Cul-de-sac negative.  Sonohysterogram performed, sterile technique, easy catheter introduction, good distention with clear endometrial polyp noted. Endometrial sample taken. Patient tolerated well.  Assessment/Plan:  51 y.o. [id number] with endometrial polyp and breakthrough bleeding. Discussed my recommendation with the patient to proceed with hysteroscopy D&C. I reviewed general was involved with the procedure to include the intraoperative, postoperative and recovery periods. Risks including infection, antibiotics, damage to internal organs including uterine perforation resulting in bowel bladder ureters vessels damage with reparative surgeries including ostomy formation all discussed. Patient will follow up for the sonohysterogram biopsy  results and schedule surgery and will represent for a preoperative consult beforehand. Patient has several trips planned and asked about postponing the surgery 1-2 months. I reviewed unlikely this represents a malignancy but no guarantees and she understands and accepts this.    Melanie LordsFONTAINE,Melanie Wiggins P MD, 11:45 AM 07/19/2016

## 2016-07-20 ENCOUNTER — Other Ambulatory Visit: Payer: Managed Care, Other (non HMO)

## 2016-07-20 ENCOUNTER — Ambulatory Visit: Payer: Managed Care, Other (non HMO) | Admitting: Gynecology

## 2016-07-21 ENCOUNTER — Telehealth: Payer: Self-pay

## 2016-07-21 NOTE — Telephone Encounter (Signed)
I called patient and left message to call me to discuss scheduling surgery.

## 2016-07-24 ENCOUNTER — Other Ambulatory Visit: Payer: Managed Care, Other (non HMO)

## 2016-07-24 ENCOUNTER — Ambulatory Visit: Payer: Managed Care, Other (non HMO) | Admitting: Gynecology

## 2016-07-26 ENCOUNTER — Telehealth: Payer: Self-pay

## 2016-07-26 MED ORDER — MISOPROSTOL 200 MCG PO TABS: ORAL_TABLET | ORAL | 0 refills | Status: DC

## 2016-07-26 NOTE — Telephone Encounter (Signed)
Patient called today requesting a Friday end of Apr/May.  I reviewed her ins benefits with her and her estimated [employer] prepayment due by one week prior to surgery.  I will send her a financial letter as well.  We discussed the need for Cytotec tab vaginally hs before surgery and Rx sent.  Pre op was scheduled for 09/01/16 at 2:30pm.

## 2016-08-28 ENCOUNTER — Encounter (HOSPITAL_BASED_OUTPATIENT_CLINIC_OR_DEPARTMENT_OTHER): Payer: Self-pay | Admitting: *Deleted

## 2016-08-28 NOTE — Progress Notes (Addendum)
NPO AFTER MN.  ARRIVE AT 0600.  NEEDS EKG AND URINE PREG.  GETTING CBC AND BMET DONE Tuesday 08-29-2016.  WILL TAKE NORVASC AND BIRTH CONTROL PILL AM DOS W/ SIPS OF WATER.  ADDENDUM:  RECEIVED CALL FROM OFFICE VIA PHONE , KATHY RN.  STATED PER DR FONTAINE PT HAD CBC AND CMET DONE IN OFFICE TODAY AND RESULTS WILL BE IN EPIC. PT  STILL NEED URINE PREG. TEST DOS.

## 2016-08-29 ENCOUNTER — Encounter: Payer: Self-pay | Admitting: Gynecology

## 2016-08-29 ENCOUNTER — Ambulatory Visit (INDEPENDENT_AMBULATORY_CARE_PROVIDER_SITE_OTHER): Payer: Managed Care, Other (non HMO) | Admitting: Gynecology

## 2016-08-29 VITALS — BP 124/78

## 2016-08-29 DIAGNOSIS — N926 Irregular menstruation, unspecified: Secondary | ICD-10-CM | POA: Diagnosis not present

## 2016-08-29 DIAGNOSIS — N840 Polyp of corpus uteri: Secondary | ICD-10-CM

## 2016-08-29 LAB — CMP 10231
AG RATIO: 1.3 ratio (ref 1.0–2.5)
ALK PHOS: 42 U/L (ref 33–115)
ALT: 12 U/L (ref 6–29)
AST: 16 U/L (ref 10–35)
Albumin: 3.8 g/dL (ref 3.6–5.1)
BILIRUBIN TOTAL: 0.3 mg/dL (ref 0.2–1.2)
BUN / CREAT RATIO: 20.3 ratio (ref 6–22)
BUN: 15 mg/dL (ref 7–25)
CO2: 18 mmol/L — ABNORMAL LOW (ref 20–31)
Calcium: 9 mg/dL (ref 8.6–10.2)
Chloride: 106 mmol/L (ref 98–110)
Creat: 0.74 mg/dL (ref 0.50–1.10)
GLOBULIN: 2.9 g/dL (ref 1.9–3.7)
GLUCOSE: 84 mg/dL (ref 65–99)
POTASSIUM: 4.2 mmol/L (ref 3.5–5.3)
SODIUM: 137 mmol/L (ref 135–146)
TOTAL PROTEIN: 6.7 g/dL (ref 6.1–8.1)

## 2016-08-29 LAB — CBC WITH DIFFERENTIAL/PLATELET
BASOS ABS: 80 {cells}/uL (ref 0–200)
Basophils Relative: 1 %
EOS ABS: 80 {cells}/uL (ref 15–500)
EOS PCT: 1 %
HCT: 43 % (ref 35.0–45.0)
Hemoglobin: 14 g/dL (ref 11.7–15.5)
LYMPHS PCT: 28 %
Lymphs Abs: 2240 cells/uL (ref 850–3900)
MCH: 30.6 pg (ref 27.0–33.0)
MCHC: 32.6 g/dL (ref 32.0–36.0)
MCV: 93.9 fL (ref 80.0–100.0)
MONOS PCT: 4 %
MPV: 10.2 fL (ref 7.5–12.5)
Monocytes Absolute: 320 cells/uL (ref 200–950)
NEUTROS ABS: 5280 {cells}/uL (ref 1500–7800)
Neutrophils Relative %: 66 %
PLATELETS: 289 10*3/uL (ref 140–400)
RBC: 4.58 MIL/uL (ref 3.80–5.10)
RDW: 13 % (ref 11.0–15.0)
WBC: 8 10*3/uL (ref 3.8–10.8)

## 2016-08-29 NOTE — Patient Instructions (Signed)
Followup for surgery as scheduled. 

## 2016-08-29 NOTE — H&P (Signed)
Melanie Wiggins [birth year] [id number]   History and Physical  Chief complaint: Irregular bleeding, endometrial polyp  History of present illness: 51 y.o. [name] with history of irregular bleeding starting late 2017. Had been on Loestrin 1/20 BCP. Double up on her pill and still had irregular bleeding. Megace was added to ultimately control her bleeding but she still was having irregular breakthrough bleeding.  Patient had sonohysterogram which showed a normal-appearing uterus with prominent endometrial focus. Fluid instillation showed a clear endometrial polyp. Endometrial sample showed proliferative endometrium. Patient is admitted for hysteroscopy, D&C and resection of her endometrial polyp  Past medical history,surgical history, medications, allergies, family history and social history were all reviewed and documented in the EPIC chart.  ROS:  Was performed and pertinent positives and negatives are included in the history of present illness.  Exam:  Melanie Wiggins assistant Vitals:   08/29/16 1156  BP: 124/78   General: well developed, well nourished female, no acute distress HEENT: normal  Lungs: clear to auscultation without wheezing, rales or rhonchi  Cardiac: regular rate without rubs, murmurs or gallops  Abdomen: soft, nontender without masses, guarding, rebound, organomegaly  Pelvic: external bus vagina: normal   Cervix: grossly normal  Uterus: normal size, midline and mobile, nontender  Adnexa: without masses or tenderness    Assessment/Plan:  51 y.o. Melanie Wiggins with irregular bleeding and sonohysterogram showing an endometrial polyp for hysteroscopy D&C. I reviewed the proposed surgery with the patient to include the expected intraoperative and postoperative courses as well as the recovery period. The use of the hysteroscope, resectoscope and the D&C portion were all discussed. The risks of surgery to include infection, prolonged antibiotics, hemorrhage necessitating transfusion  and the risks of transfusion, including transfusion reaction, hepatitis, HIV, mad cow disease and other unknown entities were all discussed understood and accepted. The risk of damage to internal organs during the procedure, either immediately recognized or delay recognized, including vagina, cervix, uterus, possible perforation causing damage to bowel, bladder, ureters, vessels and nerves necessitating major exploratory reparative surgery and future reparative surgeries including bladder repair, ureteral damage repair, bowel resection, ostomy formation was also discussed understood and accepted. The potential for distended media absorption leading to metabolic complications such as fluid overload, coma and seizures was also discussed understood and accepted. She understands there are no guarantees as far as relief of her irregular bleeding. The patient's questions were answered to her satisfaction and she is ready to proceed with surgery.    Dara Lords MD, 12:17 PM 08/29/2016

## 2016-08-29 NOTE — Progress Notes (Signed)
Melanie Wiggins 1965/06/24 [id number]   Preoperative consult  Chief complaint: Irregular bleeding, endometrial polyp  History of present illness: 51 y.o. [id number] with history of irregular bleeding starting late 2017. Had been on Loestrin 1/20 BCP. Double up on her pill and still had irregular bleeding. Megace was added to ultimately control her bleeding but she still was having irregular breakthrough bleeding.  Patient had sonohysterogram which showed a normal-appearing uterus with prominent endometrial focus. Fluid instillation showed a clear endometrial polyp. Endometrial sample showed proliferative endometrium. Patient is admitted for hysteroscopy, D&C and resection of her endometrial polyp  Past medical history,surgical history, medications, allergies, family history and social history were all reviewed and documented in the EPIC chart.  ROS:  Was performed and pertinent positives and negatives are included in the history of present illness.  Exam:  Kennon Portela assistant Vitals:   08/29/16 1156  BP: 124/78   General: well developed, well nourished female, no acute distress HEENT: normal  Lungs: clear to auscultation without wheezing, rales or rhonchi  Cardiac: regular rate without rubs, murmurs or gallops  Abdomen: soft, nontender without masses, guarding, rebound, organomegaly  Pelvic: external bus vagina: normal   Cervix: grossly normal  Uterus: normal size, midline and mobile, nontender  Adnexa: without masses or tenderness    Assessment/Plan:  51 y.o. [id number] with irregular bleeding and sonohysterogram showing an endometrial polyp for hysteroscopy D&C. I reviewed the proposed surgery with the patient to include the expected intraoperative and postoperative courses as well as the recovery period. The use of the hysteroscope, resectoscope and the D&C portion were all discussed. The risks of surgery to include infection, prolonged antibiotics, hemorrhage necessitating transfusion  and the risks of transfusion, including transfusion reaction, hepatitis, HIV, mad cow disease and other unknown entities were all discussed understood and accepted. The risk of damage to internal organs during the procedure, either immediately recognized or delay recognized, including vagina, cervix, uterus, possible perforation causing damage to bowel, bladder, ureters, vessels and nerves necessitating major exploratory reparative surgery and future reparative surgeries including bladder repair, ureteral damage repair, bowel resection, ostomy formation was also discussed understood and accepted. The potential for distended media absorption leading to metabolic complications such as fluid overload, coma and seizures was also discussed understood and accepted. She understands there are no guarantees as far as relief of her irregular bleeding. The patient's questions were answered to her satisfaction and she is ready to proceed with surgery.    Dara Lords MD, 12:13 PM 08/29/2016

## 2016-09-01 ENCOUNTER — Ambulatory Visit: Payer: Managed Care, Other (non HMO) | Admitting: Gynecology

## 2016-09-08 ENCOUNTER — Encounter (HOSPITAL_BASED_OUTPATIENT_CLINIC_OR_DEPARTMENT_OTHER): Payer: Self-pay

## 2016-09-08 ENCOUNTER — Other Ambulatory Visit: Payer: Self-pay

## 2016-09-08 ENCOUNTER — Ambulatory Visit (HOSPITAL_BASED_OUTPATIENT_CLINIC_OR_DEPARTMENT_OTHER)
Admission: RE | Admit: 2016-09-08 | Discharge: 2016-09-08 | Disposition: A | Discharge status: Home / Self Care | Payer: Managed Care, Other (non HMO) | Source: Ambulatory Visit | Attending: Gynecology | Admitting: Gynecology

## 2016-09-08 ENCOUNTER — Ambulatory Visit (HOSPITAL_BASED_OUTPATIENT_CLINIC_OR_DEPARTMENT_OTHER): Payer: Managed Care, Other (non HMO) | Admitting: Anesthesiology

## 2016-09-08 ENCOUNTER — Encounter (HOSPITAL_BASED_OUTPATIENT_CLINIC_OR_DEPARTMENT_OTHER)
Admission: RE | Disposition: A | Discharge status: Home / Self Care | Payer: Self-pay | Source: Ambulatory Visit | Attending: Gynecology

## 2016-09-08 DIAGNOSIS — N840 Polyp of corpus uteri: Secondary | ICD-10-CM | POA: Diagnosis present

## 2016-09-08 DIAGNOSIS — I10 Essential (primary) hypertension: Secondary | ICD-10-CM | POA: Diagnosis not present

## 2016-09-08 DIAGNOSIS — Z79899 Other long term (current) drug therapy: Secondary | ICD-10-CM | POA: Insufficient documentation

## 2016-09-08 DIAGNOSIS — Z793 Long term (current) use of hormonal contraceptives: Secondary | ICD-10-CM | POA: Diagnosis not present

## 2016-09-08 DIAGNOSIS — N926 Irregular menstruation, unspecified: Secondary | ICD-10-CM | POA: Diagnosis present

## 2016-09-08 HISTORY — DX: Polyp of corpus uteri: N84.0

## 2016-09-08 HISTORY — DX: Essential (primary) hypertension: I10

## 2016-09-08 HISTORY — PX: DILATATION & CURETTAGE/HYSTEROSCOPY WITH MYOSURE: SHX6511

## 2016-09-08 LAB — POCT PREGNANCY, URINE: Preg Test, Ur: NEGATIVE

## 2016-09-08 SURGERY — DILATATION & CURETTAGE/HYSTEROSCOPY WITH MYOSURE
Anesthesia: General | Site: Uterus

## 2016-09-08 MED ORDER — PROPOFOL 10 MG/ML IV BOLUS
INTRAVENOUS | Status: AC
  Filled 2016-09-08: qty 40

## 2016-09-08 MED ORDER — ACETAMINOPHEN 10 MG/ML IV SOLN
INTRAVENOUS | Status: AC
  Filled 2016-09-08: qty 100

## 2016-09-08 MED ORDER — OXYCODONE-ACETAMINOPHEN 5-325 MG PO TABS: 1.0000 | ORAL_TABLET | ORAL | 0 refills | Status: DC | PRN

## 2016-09-08 MED ORDER — KETOROLAC TROMETHAMINE 30 MG/ML IJ SOLN
INTRAMUSCULAR | Status: DC | PRN
  Administered 2016-09-08: 30 mg via INTRAVENOUS

## 2016-09-08 MED ORDER — LIDOCAINE 2% (20 MG/ML) 5 ML SYRINGE
INTRAMUSCULAR | Status: DC | PRN
  Administered 2016-09-08: 80 mg via INTRAVENOUS

## 2016-09-08 MED ORDER — LACTATED RINGERS IV SOLN
INTRAVENOUS | Status: DC
  Administered 2016-09-08: 07:00:00 via INTRAVENOUS
  Filled 2016-09-08: qty 1000

## 2016-09-08 MED ORDER — FENTANYL CITRATE (PF) 100 MCG/2ML IJ SOLN
INTRAMUSCULAR | Status: AC
  Filled 2016-09-08: qty 2

## 2016-09-08 MED ORDER — FENTANYL CITRATE (PF) 100 MCG/2ML IJ SOLN
25.0000 ug | INTRAMUSCULAR | Status: DC | PRN
  Filled 2016-09-08: qty 1

## 2016-09-08 MED ORDER — DEXAMETHASONE SODIUM PHOSPHATE 10 MG/ML IJ SOLN
INTRAMUSCULAR | Status: AC
  Filled 2016-09-08: qty 1

## 2016-09-08 MED ORDER — ONDANSETRON HCL 4 MG/2ML IJ SOLN
INTRAMUSCULAR | Status: AC
  Filled 2016-09-08: qty 2

## 2016-09-08 MED ORDER — ONDANSETRON HCL 4 MG/2ML IJ SOLN
INTRAMUSCULAR | Status: DC | PRN
  Administered 2016-09-08: 4 mg via INTRAVENOUS

## 2016-09-08 MED ORDER — LIDOCAINE HCL 1 % IJ SOLN
INTRAMUSCULAR | Status: DC | PRN
  Administered 2016-09-08: 10 mL

## 2016-09-08 MED ORDER — ACETAMINOPHEN 10 MG/ML IV SOLN
INTRAVENOUS | Status: DC | PRN
  Administered 2016-09-08: 1000 mg via INTRAVENOUS

## 2016-09-08 MED ORDER — ARTIFICIAL TEARS OPHTHALMIC OINT
TOPICAL_OINTMENT | OPHTHALMIC | Status: AC
  Filled 2016-09-08: qty 3.5

## 2016-09-08 MED ORDER — OXYCODONE HCL 5 MG/5ML PO SOLN
5.0000 mg | Freq: Once | ORAL | Status: AC | PRN
  Filled 2016-09-08: qty 5

## 2016-09-08 MED ORDER — MIDAZOLAM HCL 5 MG/5ML IJ SOLN
INTRAMUSCULAR | Status: DC | PRN
  Administered 2016-09-08: 2 mg via INTRAVENOUS

## 2016-09-08 MED ORDER — PROMETHAZINE HCL 25 MG/ML IJ SOLN
6.2500 mg | INTRAMUSCULAR | Status: DC | PRN
  Filled 2016-09-08: qty 1

## 2016-09-08 MED ORDER — DEXTROSE 5 % IV SOLN
2.0000 g | INTRAVENOUS | Status: AC
  Administered 2016-09-08: 2 g via INTRAVENOUS
  Filled 2016-09-08: qty 2

## 2016-09-08 MED ORDER — OXYCODONE-ACETAMINOPHEN 5-325 MG PO TABS
ORAL_TABLET | ORAL | Status: AC
  Filled 2016-09-08: qty 1

## 2016-09-08 MED ORDER — PROPOFOL 10 MG/ML IV BOLUS
INTRAVENOUS | Status: DC | PRN
  Administered 2016-09-08: 200 mg via INTRAVENOUS

## 2016-09-08 MED ORDER — OXYCODONE HCL 5 MG PO TABS
ORAL_TABLET | ORAL | Status: AC
  Filled 2016-09-08: qty 1

## 2016-09-08 MED ORDER — FENTANYL CITRATE (PF) 100 MCG/2ML IJ SOLN
INTRAMUSCULAR | Status: DC | PRN
  Administered 2016-09-08: 50 ug via INTRAVENOUS

## 2016-09-08 MED ORDER — DEXAMETHASONE SODIUM PHOSPHATE 4 MG/ML IJ SOLN
INTRAMUSCULAR | Status: DC | PRN
  Administered 2016-09-08: 10 mg via INTRAVENOUS

## 2016-09-08 MED ORDER — OXYCODONE HCL 5 MG PO TABS
5.0000 mg | ORAL_TABLET | Freq: Once | ORAL | Status: AC | PRN
  Administered 2016-09-08: 5 mg via ORAL
  Filled 2016-09-08: qty 1

## 2016-09-08 MED ORDER — MIDAZOLAM HCL 2 MG/2ML IJ SOLN
INTRAMUSCULAR | Status: AC
  Filled 2016-09-08: qty 2

## 2016-09-08 MED ORDER — KETOROLAC TROMETHAMINE 30 MG/ML IJ SOLN
INTRAMUSCULAR | Status: AC
  Filled 2016-09-08: qty 1

## 2016-09-08 MED ORDER — LIDOCAINE 2% (20 MG/ML) 5 ML SYRINGE
INTRAMUSCULAR | Status: AC
  Filled 2016-09-08: qty 5

## 2016-09-08 MED ORDER — CEFOTETAN DISODIUM-DEXTROSE 2-2.08 GM-% IV SOLR
INTRAVENOUS | Status: AC
  Filled 2016-09-08: qty 50

## 2016-09-08 SURGICAL SUPPLY — 37 items
BAG DECANTER FOR FLEXI CONT (MISCELLANEOUS) IMPLANT
CANISTER SUCT 3000ML PPV (MISCELLANEOUS) ×2 IMPLANT
CATH ROBINSON RED A/P 16FR (CATHETERS) ×2 IMPLANT
COVER BACK TABLE 60X90IN (DRAPES) ×2 IMPLANT
DEVICE MYOSURE LITE (MISCELLANEOUS) ×1 IMPLANT
DEVICE MYOSURE REACH (MISCELLANEOUS) IMPLANT
DILATOR CANAL MILEX (MISCELLANEOUS) IMPLANT
DRAPE HYSTEROSCOPY (DRAPE) ×2 IMPLANT
DRAPE LG THREE QUARTER DISP (DRAPES) ×2 IMPLANT
DRSG TELFA 3X8 NADH (GAUZE/BANDAGES/DRESSINGS) ×2 IMPLANT
ELECT REM PT RETURN 9FT ADLT (ELECTROSURGICAL) ×2
ELECTRODE REM PT RTRN 9FT ADLT (ELECTROSURGICAL) IMPLANT
FILTER ARTHROSCOPY CONVERTOR (FILTER) ×2 IMPLANT
GLOVE BIO SURGEON STRL SZ7.5 (GLOVE) ×4 IMPLANT
GOWN STRL REUS W/TWL LRG LVL3 (GOWN DISPOSABLE) ×2 IMPLANT
IV NS IRRIG 3000ML ARTHROMATIC (IV SOLUTION) ×2 IMPLANT
JUMPSUIT BLUE BOOT COVER DISP (PROTECTIVE WEAR) IMPLANT
KIT RM TURNOVER CYSTO AR (KITS) ×2 IMPLANT
LEGGING LITHOTOMY PAIR STRL (DRAPES) ×2 IMPLANT
MYOSURE XL FIBROID REM (MISCELLANEOUS)
NDL SAFETY ECLIPSE 18X1.5 (NEEDLE) IMPLANT
NDL SPNL 22GX3.5 QUINCKE BK (NEEDLE) ×1 IMPLANT
NEEDLE HYPO 18GX1.5 SHARP (NEEDLE)
NEEDLE SPNL 22GX3.5 QUINCKE BK (NEEDLE) ×2 IMPLANT
PACK BASIN DAY SURGERY FS (CUSTOM PROCEDURE TRAY) ×2 IMPLANT
PAD DRESSING TELFA 3X8 NADH (GAUZE/BANDAGES/DRESSINGS) ×1 IMPLANT
PAD OB MATERNITY 4.3X12.25 (PERSONAL CARE ITEMS) ×2 IMPLANT
PAD PREP 24X48 CUFFED NSTRL (MISCELLANEOUS) ×2 IMPLANT
SEAL ROD LENS SCOPE MYOSURE (ABLATOR) ×2 IMPLANT
SYR CONTROL 10ML LL (SYRINGE) ×2 IMPLANT
SYRINGE LUER LOK 1CC (MISCELLANEOUS) IMPLANT
SYSTEM TISS REMOVAL MYSR XL RM (MISCELLANEOUS) IMPLANT
TOWEL OR 17X24 6PK STRL BLUE (TOWEL DISPOSABLE) ×4 IMPLANT
TRAY DSU PREP LF (CUSTOM PROCEDURE TRAY) ×2 IMPLANT
TUBING AQUILEX INFLOW (TUBING) ×2 IMPLANT
TUBING AQUILEX OUTFLOW (TUBING) ×2 IMPLANT
WATER STERILE IRR 500ML POUR (IV SOLUTION) ×2 IMPLANT

## 2016-09-08 NOTE — Anesthesia Procedure Notes (Signed)
Procedure Name: LMA Insertion Date/Time: 09/08/2016 7:29 AM Performed by: Eilene GhaziOSE, GEORGE Pre-anesthesia Checklist: Patient identified, Emergency Drugs available, Suction available and Patient being monitored Patient Re-evaluated:Patient Re-evaluated prior to inductionOxygen Delivery Method: Circle system utilized Preoxygenation: Pre-oxygenation with 100% oxygen Intubation Type: IV induction Ventilation: Mask ventilation without difficulty LMA: LMA inserted LMA Size: 4.0 Number of attempts: 1 Airway Equipment and Method: Bite block Placement Confirmation: positive ETCO2 Tube secured with: Tape Dental Injury: Teeth and Oropharynx as per pre-operative assessment

## 2016-09-08 NOTE — Discharge Instructions (Signed)

## 2016-09-08 NOTE — Op Note (Signed)
Alinda SierrasStacy Sousa 16-Nov-1965 [id number]   Post Operative Note   Date of surgery:  09/08/2016  Pre Op Dx:  Irregular bleeding, endometrial polyp  Post Op Dx:  Irregular bleeding, endometrial polyp  Procedure:  Hysteroscopy, D&C, Myosure resection of endometrial polyp  Surgeon:  Colin BroachFONTAINE,Duilio Heritage P  Anesthesia:  General  EBL:  Minimal  Distended media discrepancy:  250 cc saline  Complications:  None  Specimen:  #1 endometrial polyp #2 endometrial curetting to pathology  Findings: EUA:  External BUS vagina normal. Cervix normal. Uterus normal size midline mobile. Adnexa without masses   Hysteroscopy:  Adequate noting fundus, anterior/posterior endometrial surfaces, right and left tubal ostia, lower uterine segment and endocervical canal all visualized. Classic appearing endometrial polyp from right fundal endometrial surface resected in its entirety.  Procedure:  The patient was taken to the operating room, was placed in the low dorsal lithotomy position, underwent general anesthesia, received a perineal/vaginal preparation with Betadine solution and the bladder was emptied with in and out Foley catheterization. The timeout was performed by the surgical team. An EUA was performed. The patient was draped in the usual fashion. The cervix was visualized with a speculum, anterior lip grasped with a single-tooth tenaculum and a paracervical block was placed using 10 cc's of 1% lidocaine. The cervix was gently dilated to admit the Myosure hysteroscope and hysteroscopy was performed with findings noted above. Using the Myosure Lite resectoscopic wand the polyp was resected in it's entirety to the level the surrounding endometrium. A gentle sharp curettage was performed. Both specimens were sent separately to pathology.  Repeat hysteroscopy showed an empty cavity with good distention and no evidence of perforation. The instruments were removed and adequate hemostasis was visualized at the tenaculum  site and external cervical os. The patient was given intraoperative Toradol, was awakened without difficulty and was taken to the recovery room in good condition having tolerated the procedure well.     Dara LordsFONTAINE,Jillayne Witte P MD, 7:52 AM 09/08/2016

## 2016-09-08 NOTE — H&P (Signed)
The patient was examined.  I reviewed the proposed surgery and consent form with the patient.  The dictated history and physical is current and accurate and all questions were answered. The patient is ready to proceed with surgery and has a realistic understanding and expectation for the outcome.   Dara LordsFONTAINE,Miklos Bidinger P MD, 7:02 AM 09/08/2016

## 2016-09-08 NOTE — Anesthesia Postprocedure Evaluation (Signed)
Anesthesia Post Note  Patient: Melanie SierrasStacy Wiggins  Procedure(s) Performed: Procedure(s) (LRB): DILATATION & CURETTAGE/HYSTEROSCOPY WITH MYOSURE (N/A)  Patient location during evaluation: PACU Anesthesia Type: General Level of consciousness: awake and alert Pain management: pain level controlled Vital Signs Assessment: post-procedure vital signs reviewed and stable Respiratory status: spontaneous breathing, nonlabored ventilation, respiratory function stable and patient connected to nasal cannula oxygen Cardiovascular status: blood pressure returned to baseline and stable Postop Assessment: no signs of nausea or vomiting Anesthetic complications: no       Last Vitals:  Vitals:   09/08/16 0804 09/08/16 0815  BP: 129/84 124/82  Pulse: 78 69  Resp: 12 14  Temp: 36.9 C     Last Pain:  Vitals:   09/08/16 0804  TempSrc:   PainSc: Asleep                 Lysbeth Dicola S

## 2016-09-08 NOTE — Transfer of Care (Signed)
  Last Vitals:  Vitals:   09/08/16 0603  BP: 130/84  Pulse: 78  Resp: 16  Temp: 37 C    Last Pain:  Vitals:   09/08/16 0603  TempSrc: Oral      Patients Stated Pain Goal: 5 (09/08/16 [id number])  Immediate Anesthesia Transfer of Care Note  Patient: Melanie Wiggins  Procedure(s) Performed: Procedure(s) (LRB): DILATATION & CURETTAGE/HYSTEROSCOPY WITH MYOSURE (N/A)  Patient Location: PACU  Anesthesia Type: General  Level of Consciousness: awake, alert  and oriented  Airway & Oxygen Therapy: Patient Spontanous Breathing and Patient connected to nasal cannula oxygen  Post-op Assessment: Report given to PACU RN and Post -op Vital signs reviewed and stable  Post vital signs: Reviewed and stable  Complications: No apparent anesthesia complications

## 2016-09-08 NOTE — Anesthesia Preprocedure Evaluation (Signed)
Anesthesia Evaluation  Patient identified by MRN, date of birth, ID band Patient awake    Reviewed: Allergy & Precautions, NPO status , Patient's Chart, lab work & pertinent test results  Airway Mallampati: II  TM Distance: >3 FB Neck ROM: Full    Dental no notable dental hx.    Pulmonary neg pulmonary ROS,    Pulmonary exam normal breath sounds clear to auscultation       Cardiovascular hypertension, Normal cardiovascular exam Rhythm:Regular Rate:Normal     Neuro/Psych negative neurological ROS  negative psych ROS   GI/Hepatic negative GI ROS, Neg liver ROS,   Endo/Other  negative endocrine ROS  Renal/GU negative Renal ROS  negative genitourinary   Musculoskeletal negative musculoskeletal ROS (+)   Abdominal   Peds negative pediatric ROS (+)  Hematology negative hematology ROS (+)   Anesthesia Other Findings   Reproductive/Obstetrics negative OB ROS                             Anesthesia Physical Anesthesia Plan  ASA: II  Anesthesia Plan: General   Post-op Pain Management:    Induction: Intravenous  Airway Management Planned: LMA  Additional Equipment:   Intra-op Plan:   Post-operative Plan: Extubation in OR  Informed Consent: I have reviewed the patients History and Physical, chart, labs and discussed the procedure including the risks, benefits and alternatives for the proposed anesthesia with the patient or authorized representative who has indicated his/her understanding and acceptance.   Dental advisory given  Plan Discussed with: CRNA and Surgeon  Anesthesia Plan Comments:         Anesthesia Quick Evaluation  

## 2016-09-11 ENCOUNTER — Encounter (HOSPITAL_BASED_OUTPATIENT_CLINIC_OR_DEPARTMENT_OTHER): Payer: Self-pay | Admitting: Gynecology

## 2016-09-11 ENCOUNTER — Telehealth: Payer: Self-pay | Admitting: *Deleted

## 2016-09-11 NOTE — Telephone Encounter (Signed)
Patient had D&C on 09/08/16 called c/o upset stomach feeling fatigue, no nausea, no vomiting, took pain medication (oxycodone) on Friday and Saturday asked if it could be related to this and surgery. Please advise

## 2016-09-11 NOTE — Telephone Encounter (Signed)
Fatigue can be anesthesia as well as pain medicine related. Upset stomach could be from the pain medicine. I would go with ibuprofen as needed for pain for now push fluids and as long as otherwise she is doing well without fever or chills, eating okay, voiding and p.m. then I would monitor and I suspect everything will clear on exam.

## 2016-09-11 NOTE — Telephone Encounter (Signed)
Left message for pt to call.

## 2016-09-12 NOTE — Telephone Encounter (Signed)
Pt informed with the below. 

## 2016-09-28 ENCOUNTER — Telehealth: Payer: Self-pay | Admitting: *Deleted

## 2016-09-28 NOTE — Telephone Encounter (Signed)
Pt called c/o light spotting post D&C on 09/08/16, pt has post op appointment tomorrow I told pt to mention to TF, more than likely a exam will be done and he will see spotting. Pt was okay with this. Pt was not in any pain.

## 2016-09-29 ENCOUNTER — Ambulatory Visit (INDEPENDENT_AMBULATORY_CARE_PROVIDER_SITE_OTHER): Payer: Managed Care, Other (non HMO) | Admitting: Gynecology

## 2016-09-29 ENCOUNTER — Encounter: Payer: Self-pay | Admitting: Gynecology

## 2016-09-29 VITALS — BP 122/78

## 2016-09-29 DIAGNOSIS — Z09 Encounter for follow-up examination after completed treatment for conditions other than malignant neoplasm: Secondary | ICD-10-CM

## 2016-09-29 NOTE — Patient Instructions (Addendum)
Call if your irregular bleeding continues.  Follow up this coming fall when your annual exam is due.

## 2016-09-29 NOTE — Progress Notes (Signed)
    Melanie Wiggins [birth year] [id number]        51 y.o.  [id number] presents for postoperative visit status post hysteroscopic resection of endometrial polyp 09/08/2016. Patient notes that she has bled on and off lightly since then. Had started back on her Loestrin 1/20 equivalent pill. Having no pain or other symptoms.  Past medical history,surgical history, problem list, medications, allergies, family history and social history were all reviewed and documented in the EPIC chart.  Directed ROS with pertinent positives and negatives documented in the history of present illness/assessment and plan.  Exam: Melanie Wiggins assistant Vitals:   09/29/16 1458  BP: 122/78   General appearance:  Normal Abdomen soft nontender without masses guarding rebound Pelvic external BUS vagina with mucousy bloody discharge no frank bleeding. Cervix grossly normal. Uterus normal size midline mobile nontender. Adnexa without masses or tenderness.  Assessment/Plan:  51 y.o. Melanie Wiggins with normal postoperative visit. Reviewed pictures from the surgery showing the endometrial polyp and her pathology showing benign endometrial polyp. Reviewed her irregular bleeding with her. She is at the end of her pill pack and will have her menses next week. We'll restart her pills and if irregular bleeding continues she will call us. If irregular bleeding resolves and she will follow up this coming fall when due for annual exam    Dara LordsFONTAINE,Mykelti Goldenstein P MD, 3:22 PM 09/29/2016

## 2016-10-02 NOTE — Anesthesia Postprocedure Evaluation (Signed)
Anesthesia Post Note  Patient: Melanie SierrasStacy Wiggins  Procedure(s) Performed: Procedure(s) (LRB): DILATATION & CURETTAGE/HYSTEROSCOPY WITH MYOSURE (N/A)     Anesthesia Post Evaluation  Last Vitals:  Vitals:   09/08/16 0900 09/08/16 0949  BP: 131/83 127/81  Pulse: 65 72  Resp: 15 16  Temp:  36.6 C    Last Pain:  Vitals:   09/08/16 0932  TempSrc:   PainSc: 2                  Biruk Troia S

## 2016-10-02 NOTE — Addendum Note (Signed)
Addendum  created 10/02/16 1435 by Kiernan Farkas, MD   Sign clinical note    

## 2016-10-06 ENCOUNTER — Other Ambulatory Visit: Payer: Self-pay | Admitting: Women's Health

## 2016-10-06 DIAGNOSIS — Z1231 Encounter for screening mammogram for malignant neoplasm of breast: Secondary | ICD-10-CM

## 2016-10-20 ENCOUNTER — Ambulatory Visit
Admission: RE | Admit: 2016-10-20 | Discharge: 2016-10-20 | Disposition: A | Discharge status: Home / Self Care | Payer: Managed Care, Other (non HMO) | Source: Ambulatory Visit | Attending: Women's Health | Admitting: Women's Health

## 2016-10-20 DIAGNOSIS — Z1231 Encounter for screening mammogram for malignant neoplasm of breast: Secondary | ICD-10-CM

## 2017-04-29 ENCOUNTER — Other Ambulatory Visit: Payer: Self-pay | Admitting: Women's Health

## 2017-04-29 DIAGNOSIS — Z3041 Encounter for surveillance of contraceptive pills: Secondary | ICD-10-CM

## 2017-05-16 ENCOUNTER — Telehealth: Payer: Self-pay | Admitting: *Deleted

## 2017-05-16 DIAGNOSIS — Z3041 Encounter for surveillance of contraceptive pills: Secondary | ICD-10-CM

## 2017-05-16 MED ORDER — NORETHIN ACE-ETH ESTRAD-FE 1-20 MG-MCG(24) PO TABS: 1.0000 | ORAL_TABLET | Freq: Every day | ORAL | 0 refills | Status: DC

## 2017-05-16 NOTE — Telephone Encounter (Signed)
Pt has annual scheduled on 07/04/17, needs refill on birth control pills, Rx sent.

## 2017-07-04 ENCOUNTER — Encounter: Payer: Managed Care, Other (non HMO) | Admitting: Women's Health

## 2017-08-05 ENCOUNTER — Other Ambulatory Visit: Payer: Self-pay | Admitting: Women's Health

## 2017-08-05 DIAGNOSIS — Z3041 Encounter for surveillance of contraceptive pills: Secondary | ICD-10-CM

## 2017-08-06 NOTE — Telephone Encounter (Signed)
Annual on 08/15/17

## 2017-08-15 ENCOUNTER — Ambulatory Visit (INDEPENDENT_AMBULATORY_CARE_PROVIDER_SITE_OTHER): Payer: 59 | Admitting: Women's Health

## 2017-08-15 ENCOUNTER — Encounter: Payer: Self-pay | Admitting: Women's Health

## 2017-08-15 VITALS — BP 130/86 | Ht 66.0 in | Wt 189.2 lb

## 2017-08-15 DIAGNOSIS — Z01419 Encounter for gynecological examination (general) (routine) without abnormal findings: Secondary | ICD-10-CM | POA: Diagnosis not present

## 2017-08-15 DIAGNOSIS — Z3041 Encounter for surveillance of contraceptive pills: Secondary | ICD-10-CM | POA: Diagnosis not present

## 2017-08-15 MED ORDER — FLUCONAZOLE 100 MG PO TABS: 100.0000 mg | ORAL_TABLET | Freq: Every day | ORAL | 1 refills | Status: DC

## 2017-08-15 MED ORDER — NORETHIN-ETH ESTRAD-FE BIPHAS 1 MG-10 MCG / 10 MCG PO TABS: 1.0000 | ORAL_TABLET | Freq: Every day | ORAL | 4 refills | Status: DC

## 2017-08-15 NOTE — Patient Instructions (Signed)
Colonoscopy  [phone]   Dr Carlean Purl  [facility] Health Maintenance, Female Adopting a healthy lifestyle and getting preventive care can go a long way to promote health and wellness. Talk with your health care provider about what schedule of regular examinations is right for you. This is a good chance for you to check in with your provider about disease prevention and staying healthy. In between checkups, there are plenty of things you can do on your own. Experts have done a lot of research about which lifestyle changes and preventive measures are most likely to keep you healthy. Ask your health care provider for more information. Weight and diet Eat a healthy diet  Be sure to include plenty of vegetables, fruits, low-fat dairy products, and lean protein.  Do not eat a lot of foods high in solid fats, added sugars, or salt.  Get regular exercise. This is one of the most important things you can do for your health. ? Most adults should exercise for at least 150 minutes each week. The exercise should increase your heart rate and make you sweat (moderate-intensity exercise). ? Most adults should also do strengthening exercises at least twice a week. This is in addition to the moderate-intensity exercise.  Maintain a healthy weight  Body mass index (BMI) is a measurement that can be used to identify possible weight problems. It estimates body fat based on height and weight. Your health care provider can help determine your BMI and help you achieve or maintain a healthy weight.  For females 17 years of age and older: ? A BMI below 18.5 is considered underweight. ? A BMI of 18.5 to 24.9 is normal. ? A BMI of 25 to 29.9 is considered overweight. ? A BMI of 30 and above is considered obese.  Watch levels of cholesterol and blood lipids  You should start having your blood tested for lipids and cholesterol at 51 years of age, then have this test every 5 years.  You may need to have your cholesterol  levels checked more often if: ? Your lipid or cholesterol levels are high. ? You are older than 51 years of age. ? You are at high risk for heart disease.  Cancer screening Lung Cancer  Lung cancer screening is recommended for adults 68-8 years old who are at high risk for lung cancer because of a history of smoking.  A yearly low-dose CT scan of the lungs is recommended for people who: ? Currently smoke. ? Have quit within the past 15 years. ? Have at least a 30-pack-year history of smoking. A pack year is smoking an average of one pack of cigarettes a day for 1 year.  Yearly screening should continue until it has been 15 years since you quit.  Yearly screening should stop if you develop a health problem that would prevent you from having lung cancer treatment.  Breast Cancer  Practice breast self-awareness. This means understanding how your breasts normally appear and feel.  It also means doing regular breast self-exams. Let your health care provider know about any changes, no matter how small.  If you are in your 20s or 30s, you should have a clinical breast exam (CBE) by a health care provider every 1-3 years as part of a regular health exam.  If you are 64 or older, have a CBE every year. Also consider having a breast X-ray (mammogram) every year.  If you have a family history of breast cancer, talk to your health care provider  provider about genetic screening.  If you are at high risk for breast cancer, talk to your health care provider about having an MRI and a mammogram every year.  Breast cancer gene (BRCA) assessment is recommended for women who have family members with BRCA-related cancers. BRCA-related cancers include: ? Breast. ? Ovarian. ? Tubal. ? Peritoneal cancers.  Results of the assessment will determine the need for genetic counseling and BRCA1 and BRCA2 testing.  Cervical Cancer Your health care provider may recommend that you be screened regularly for cancer of  the pelvic organs (ovaries, uterus, and vagina). This screening involves a pelvic examination, including checking for microscopic changes to the surface of your cervix (Pap test). You may be encouraged to have this screening done every 3 years, beginning at age 21.  For women ages 30-65, health care providers may recommend pelvic exams and Pap testing every 3 years, or they may recommend the Pap and pelvic exam, combined with testing for human papilloma virus (HPV), every 5 years. Some types of HPV increase your risk of cervical cancer. Testing for HPV may also be done on women of any age with unclear Pap test results.  Other health care providers may not recommend any screening for nonpregnant women who are considered low risk for pelvic cancer and who do not have symptoms. Ask your health care provider if a screening pelvic exam is right for you.  If you have had past treatment for cervical cancer or a condition that could lead to cancer, you need Pap tests and screening for cancer for at least 20 years after your treatment. If Pap tests have been discontinued, your risk factors (such as having a new sexual partner) need to be reassessed to determine if screening should resume. Some women have medical problems that increase the chance of getting cervical cancer. In these cases, your health care provider may recommend more frequent screening and Pap tests.  Colorectal Cancer  This type of cancer can be detected and often prevented.  Routine colorectal cancer screening usually begins at 50 years of age and continues through 51 years of age.  Your health care provider may recommend screening at an earlier age if you have risk factors for colon cancer.  Your health care provider may also recommend using home test kits to check for hidden blood in the stool.  A small camera at the end of a tube can be used to examine your colon directly (sigmoidoscopy or colonoscopy). This is done to check for the  earliest forms of colorectal cancer.  Routine screening usually begins at age 50.  Direct examination of the colon should be repeated every 5-10 years through 51 years of age. However, you may need to be screened more often if early forms of precancerous polyps or small growths are found.  Skin Cancer  Check your skin from head to toe regularly.  Tell your health care provider about any new moles or changes in moles, especially if there is a change in a mole's shape or color.  Also tell your health care provider if you have a mole that is larger than the size of a pencil eraser.  Always use sunscreen. Apply sunscreen liberally and repeatedly throughout the day.  Protect yourself by wearing long sleeves, pants, a wide-brimmed hat, and sunglasses whenever you are outside.  Heart disease, diabetes, and high blood pressure  High blood pressure causes heart disease and increases the risk of stroke. High blood pressure is more likely to develop in: ?   People who have blood pressure in the high end of the normal range (130-139/85-89 mm Hg). ? People who are overweight or obese. ? People who are African American.  If you are 18-39 years of age, have your blood pressure checked every 3-5 years. If you are 40 years of age or older, have your blood pressure checked every year. You should have your blood pressure measured twice-once when you are at a hospital or clinic, and once when you are not at a hospital or clinic. Record the average of the two measurements. To check your blood pressure when you are not at a hospital or clinic, you can use: ? An automated blood pressure machine at a pharmacy. ? A home blood pressure monitor.  If you are between 55 years and 79 years old, ask your health care provider if you should take aspirin to prevent strokes.  Have regular diabetes screenings. This involves taking a blood sample to check your fasting blood sugar level. ? If you are at a normal weight and  have a low risk for diabetes, have this test once every three years after 51 years of age. ? If you are overweight and have a high risk for diabetes, consider being tested at a younger age or more often. Preventing infection Hepatitis B  If you have a higher risk for hepatitis B, you should be screened for this virus. You are considered at high risk for hepatitis B if: ? You were born in a country where hepatitis B is common. Ask your health care provider which countries are considered high risk. ? Your parents were born in a high-risk country, and you have not been immunized against hepatitis B (hepatitis B vaccine). ? You have HIV or AIDS. ? You use needles to inject street drugs. ? You live with someone who has hepatitis B. ? You have had sex with someone who has hepatitis B. ? You get hemodialysis treatment. ? You take certain medicines for conditions, including cancer, organ transplantation, and autoimmune conditions.  Hepatitis C  Blood testing is recommended for: ? Everyone born from 1945 through 1965. ? Anyone with known risk factors for hepatitis C.  Sexually transmitted infections (STIs)  You should be screened for sexually transmitted infections (STIs) including gonorrhea and chlamydia if: ? You are sexually active and are younger than 51 years of age. ? You are older than 51 years of age and your health care provider tells you that you are at risk for this type of infection. ? Your sexual activity has changed since you were last screened and you are at an increased risk for chlamydia or gonorrhea. Ask your health care provider if you are at risk.  If you do not have HIV, but are at risk, it may be recommended that you take a prescription medicine daily to prevent HIV infection. This is called pre-exposure prophylaxis (PrEP). You are considered at risk if: ? You are sexually active and do not regularly use condoms or know the HIV status of your partner(s). ? You take drugs by  injection. ? You are sexually active with a partner who has HIV.  Talk with your health care provider about whether you are at high risk of being infected with HIV. If you choose to begin PrEP, you should first be tested for HIV. You should then be tested every 3 months for as long as you are taking PrEP. Pregnancy  If you are premenopausal and you may become pregnant, ask your health   care provider about preconception counseling.  If you may become pregnant, take 400 to 800 micrograms (mcg) of folic acid every day.  If you want to prevent pregnancy, talk to your health care provider about birth control (contraception). Osteoporosis and menopause  Osteoporosis is a disease in which the bones lose minerals and strength with aging. This can result in serious bone fractures. Your risk for osteoporosis can be identified using a bone density scan.  If you are 38 years of age or older, or if you are at risk for osteoporosis and fractures, ask your health care provider if you should be screened.  Ask your health care provider whether you should take a calcium or vitamin D supplement to lower your risk for osteoporosis.  Menopause may have certain physical symptoms and risks.  Hormone replacement therapy may reduce some of these symptoms and risks. Talk to your health care provider about whether hormone replacement therapy is right for you. Follow these instructions at home:  Schedule regular health, dental, and eye exams.  Stay current with your immunizations.  Do not use any tobacco products including cigarettes, chewing tobacco, or electronic cigarettes.  If you are pregnant, do not drink alcohol.  If you are breastfeeding, limit how much and how often you drink alcohol.  Limit alcohol intake to no more than 1 drink per day for nonpregnant women. One drink equals 12 ounces of beer, 5 ounces of wine, or 1 ounces of hard liquor.  Do not use street drugs.  Do not share needles.  Ask  your health care provider for help if you need support or information about quitting drugs.  Tell your health care provider if you often feel depressed.  Tell your health care provider if you have ever been abused or do not feel safe at home. This information is not intended to replace advice given to you by your health care provider. Make sure you discuss any questions you have with your health care provider. Document Released: 10/31/2010 Document Revised: 09/23/2015 Document Reviewed: 01/19/2015 Elsevier Interactive Patient Education  [employer].

## 2017-08-15 NOTE — Progress Notes (Signed)
Alinda SierrasStacy Gallion 1965/06/12 [id number]    History:    Presents for annual exam.  On Loestrin 1/20 with good relief of menorrhagia.  Normal Pap and mammogram history.  2018 benign endometrial polyp.  Hypertension managed by primary care.  Has not had a screening colonoscopy.  Past medical history, past surgical history, family history and social history were all reviewed and documented in the EPIC chart.  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  Mother hypertension, diverticulosis and kidney disease.  Father hypertension.  ROS:  A ROS was performed and pertinent positives and negatives are included.  Exam:  Vitals:   08/15/17 1054  BP: 130/86  Weight: 189 lb 3.2 oz (85.8 kg)  Height: 5\' 6"  (1.676 m)   Body mass index is 30.54 kg/m.   General appearance:  Normal Thyroid:  Symmetrical, normal in size, without palpable masses or nodularity. Respiratory  Auscultation:  Clear without wheezing or rhonchi Cardiovascular  Auscultation:  Regular rate, without rubs, murmurs or gallops  Edema/varicosities:  Not grossly evident Abdominal  Soft,nontender, without masses, guarding or rebound.  Liver/spleen:  No organomegaly noted  Hernia:  None appreciated  Skin  Inspection:  Grossly normal   Breasts: Examined lying and sitting.     Right: Without masses, retractions, discharge or axillary adenopathy.     Left: Without masses, retractions, discharge or axillary adenopathy. Gentitourinary   Inguinal/mons:  Normal without inguinal adenopathy  External genitalia:  Normal  BUS/Urethra/Skene's glands:  Normal  Vagina:  Normal  Cervix:  Normal  Uterus:   normal in size, shape and contour.  Midline and mobile  Adnexa/parametria:     Rt: Without masses or tenderness.   Lt: Without masses or tenderness.  Anus and perineum: Normal  Digital rectal exam: Normal sphincter tone without palpated masses or  tenderness  Assessment/Plan:  52 y.o. MWF G3P3 for annual exam with occasional hot flashes, some cycle changes questions beginning menopause.  Monthly cycle on Loestrin 1/20 Hypertension-primary care manages labs and meds Obesity  Plan: Contraception options reviewed, will try lo Loestrin, prescription, proper use given and reviewed 10 mcg pill, less risk for blood clots and strokes may help with blood pressure.  SBEs, continue annual screening mammogram, calcium rich foods, vitamin D 2000 daily encouraged.  Reviewed low-salt, DASH diet, importance of increasing regular cardio type exercise 30 minutes most days of the week.  Has recently started exercising more.  Reviewed importance of a low carb diet for weight loss.  Greening colonoscopy discussed, [facility] information given instructed to schedule.  Pap normal with negative HR HPV 2017, new screening guidelines reviewed.    Harrington Challengerancy J Young [facility]NP, 12:24 PM 08/15/2017

## 2017-09-18 ENCOUNTER — Other Ambulatory Visit: Payer: Self-pay | Admitting: Women's Health

## 2017-09-18 DIAGNOSIS — Z1231 Encounter for screening mammogram for malignant neoplasm of breast: Secondary | ICD-10-CM

## 2017-10-15 ENCOUNTER — Other Ambulatory Visit: Payer: Self-pay | Admitting: Family Medicine

## 2017-10-15 DIAGNOSIS — E049 Nontoxic goiter, unspecified: Secondary | ICD-10-CM

## 2017-10-26 ENCOUNTER — Ambulatory Visit
Admission: RE | Admit: 2017-10-26 | Discharge: 2017-10-26 | Disposition: A | Discharge status: Home / Self Care | Payer: 59 | Source: Ambulatory Visit | Attending: Women's Health | Admitting: Women's Health

## 2017-10-26 ENCOUNTER — Ambulatory Visit
Admission: RE | Admit: 2017-10-26 | Discharge: 2017-10-26 | Disposition: A | Discharge status: Home / Self Care | Payer: 59 | Source: Ambulatory Visit | Attending: Family Medicine | Admitting: Family Medicine

## 2017-10-26 DIAGNOSIS — E049 Nontoxic goiter, unspecified: Secondary | ICD-10-CM

## 2017-10-26 DIAGNOSIS — Z1231 Encounter for screening mammogram for malignant neoplasm of breast: Secondary | ICD-10-CM

## 2017-10-29 ENCOUNTER — Encounter (INDEPENDENT_AMBULATORY_CARE_PROVIDER_SITE_OTHER): Payer: Self-pay

## 2018-11-15 ENCOUNTER — Other Ambulatory Visit: Payer: Self-pay

## 2018-11-29 ENCOUNTER — Telehealth: Payer: Self-pay | Admitting: *Deleted

## 2018-11-29 ENCOUNTER — Other Ambulatory Visit: Payer: Self-pay | Admitting: Women's Health

## 2018-11-29 DIAGNOSIS — Z1231 Encounter for screening mammogram for malignant neoplasm of breast: Secondary | ICD-10-CM

## 2018-11-29 MED ORDER — LO LOESTRIN FE 1 MG-10 MCG / 10 MCG PO TABS: 1.0000 | ORAL_TABLET | Freq: Every day | ORAL | 0 refills | Status: DC

## 2018-11-29 NOTE — Telephone Encounter (Signed)
Patient has annual exam scheduled on 12/31/18 needs refill on birth control pills, Rx sent

## 2018-12-31 ENCOUNTER — Encounter: Payer: Self-pay | Admitting: Women's Health

## 2018-12-31 ENCOUNTER — Ambulatory Visit (INDEPENDENT_AMBULATORY_CARE_PROVIDER_SITE_OTHER): Payer: 59 | Admitting: Women's Health

## 2018-12-31 ENCOUNTER — Other Ambulatory Visit: Payer: Self-pay

## 2018-12-31 VITALS — BP 128/80 | Ht 66.0 in | Wt 196.0 lb

## 2018-12-31 DIAGNOSIS — Z13220 Encounter for screening for lipoid disorders: Secondary | ICD-10-CM | POA: Diagnosis not present

## 2018-12-31 DIAGNOSIS — Z01419 Encounter for gynecological examination (general) (routine) without abnormal findings: Secondary | ICD-10-CM | POA: Diagnosis not present

## 2018-12-31 DIAGNOSIS — Z23 Encounter for immunization: Secondary | ICD-10-CM

## 2018-12-31 DIAGNOSIS — E559 Vitamin D deficiency, unspecified: Secondary | ICD-10-CM

## 2018-12-31 MED ORDER — PROGESTERONE MICRONIZED 100 MG PO CAPS: 100.0000 mg | ORAL_CAPSULE | Freq: Every day | ORAL | 12 refills | Status: AC

## 2018-12-31 MED ORDER — MEDROXYPROGESTERONE ACETATE 10 MG PO TABS: 10.0000 mg | ORAL_TABLET | Freq: Every day | ORAL | 0 refills | Status: DC

## 2018-12-31 MED ORDER — ESTRADIOL 0.05 MG/24HR TD PTTW: 1.0000 | MEDICATED_PATCH | TRANSDERMAL | 12 refills | Status: DC

## 2018-12-31 NOTE — Progress Notes (Signed)
Melanie Wiggins 07/22/1965 [id number]    History:    Presents for annual exam.  Had been on lo Loestrin with light monthly cycle, stopped, amenorrheic with elevated FSH of 88 that was drawn at place of employment 09/2018.  Hot flashes were severe started back on lo Loestrin 09/2018 which helped the hot flashes but has had some irregular bleeding since starting.  2018 endometrial polyps.  Normal Pap and mammogram history.  Has not had a screening colonoscopy.  Primary care manages hypertension.  Past medical history, past surgical history, family history and social history were all reviewed and documented in the EPIC chart.  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  2 sons both graduated from college working in [place], [place] 53 senior in high school type 1 diabetes doing well with managing.  ROS:  A ROS was performed and pertinent positives and negatives are included.  Exam:  Vitals:   12/31/18 1457  BP: 128/80  Weight: 196 lb (88.9 kg)  Height: 5\' 6"  (1.676 m)   Body mass index is 31.64 kg/m.   General appearance:  Normal Thyroid:  Symmetrical, normal in size, without palpable masses or nodularity. Respiratory  Auscultation:  Clear without wheezing or rhonchi Cardiovascular  Auscultation:  Regular rate, without rubs, murmurs or gallops  Edema/varicosities:  Not grossly evident Abdominal  Soft,nontender, without masses, guarding or rebound.  Liver/spleen:  No organomegaly noted  Hernia:  None appreciated  Skin  Inspection:  Grossly normal   Breasts: Examined lying and sitting.     Right: Without masses, retractions, discharge or axillary adenopathy.     Left: Without masses, retractions, discharge or axillary adenopathy. Gentitourinary   Inguinal/mons:  Normal without inguinal adenopathy  External genitalia:  Normal  BUS/Urethra/Skene's glands:  Normal  Vagina:  Normal moderate menses  Cervix:  Normal  Uterus:   normal in size, shape and contour.  Midline and  mobile  Adnexa/parametria:     Rt: Without masses or tenderness.   Lt: Without masses or tenderness.  Anus and perineum: Normal  Digital rectal exam: Normal sphincter tone without palpated masses or tenderness  Assessment/Plan:  53 y.o. MWF G3, P3 for annual exam with irregular bleeding on OCs/menopausal  Menopausal with numerous hot flushes Hypertension-primary care manages labs and meds 2018 benign endometrial polyp Obesity  Plan: Menopause reviewed, reviewed best not to use OCs, HRT reviewed risks of blood clots, strokes and breast cancer.  Reviewed best to use shortest amount of time, WHI reviewed.  Options reviewed we will try Vivelle dot 0.05 patch twice weekly and Prometrium 100 mg at bedtime daily.  Discussed cyclic Prometrium, states prefers to take daily.  Provera 10 mg daily for 10 days and then start HRT.  Instructed to call if continued spotting/bleeding occurs.  SBEs, annual screening mammogram as scheduled, calcium rich foods, vitamin D 2000 daily encouraged.  Screening colonoscopy discussed, [facility] information given instructed to schedule aware of  need to increase regular cardio type exercise and decrease calorie/carbs.  Pap normal 2017, Pap with HR HPV typing, new screening guidelines reviewed.    Huel Cote [facility], 5:06 PM 12/31/2018

## 2018-12-31 NOTE — Progress Notes (Signed)
lab

## 2018-12-31 NOTE — Patient Instructions (Addendum)
[facility]  Dr Leone Payor  [phone]  Make lab appt.    Menopause and Hormone Replacement Therapy Menopause is a normal time of life when menstrual periods stop completely and the ovaries stop producing the female hormones estrogen and progesterone. This lack of hormones can affect your health and cause undesirable symptoms. Hormone replacement therapy (HRT) can relieve some of those symptoms. What is hormone replacement therapy? HRT is the use of artificial (synthetic) hormones to replace hormones that your body has stopped producing because you have reached menopause. What are my options for HRT?  HRT may consist of the synthetic hormones estrogen and progestin, or it may consist of only estrogen (estrogen-only therapy). You and your health care provider will decide which form of HRT is best for you. If you choose to be on HRT and you have a uterus, estrogen and progestin are usually prescribed. Estrogen-only therapy is used for women who do not have a uterus. Possible options for taking HRT include:  Pills.  Patches.  Gels.  Sprays.  Vaginal cream.  Vaginal rings.  Vaginal inserts. The amount of hormone(s) that you take and how long you take the hormone(s) varies according to your health. It is important to:  Begin HRT with the lowest possible dosage.  Stop HRT as soon as your health care provider tells you to stop.  Work with your health care provider so that you feel informed and comfortable with your decisions. What are the benefits of HRT? HRT can reduce the frequency and severity of menopausal symptoms. Benefits of HRT vary according to the kind of symptoms that you have, how severe they are, and your overall health. HRT may help to improve the following symptoms of menopause:  Hot flashes and night sweats. These are sudden feelings of heat that spread over the face and body. The skin may turn red, like a blush. Night sweats are hot flashes that happen while you are  sleeping or trying to sleep.  Bone loss (osteoporosis). The body loses calcium more quickly after menopause, causing the bones to become weaker. This can increase the risk for bone breaks (fractures).  Vaginal dryness. The lining of the vagina can become thin and dry, which can cause pain during sex or cause infection, burning, or itching.  Urinary tract infections.  Urinary incontinence. This is the inability to control when you pass urine.  Irritability.  Short-term memory problems. What are the risks of HRT? Risks of HRT vary depending on your individual health and medical history. Risks of HRT also depend on whether you receive both estrogen and progestin or you receive estrogen only. HRT may increase the risk of:  Spotting. This is when a small amount of blood leaks from the vagina unexpectedly.  Endometrial cancer. This cancer is in the lining of the uterus (endometrium).  Breast cancer.  Increased density of breast tissue. This can make it harder to find breast cancer on a breast X-ray (mammogram).  Stroke.  Heart disease.  Blood clots.  Gallbladder disease.  Liver disease. Risks of HRT can increase if you have any of the following conditions:  Endometrial cancer.  Liver disease.  Heart disease.  Breast cancer.  History of blood clots.  History of stroke. Follow these instructions at home:  Take over-the-counter and prescription medicines only as told by your health care provider.  Get mammograms, pelvic exams, and medical checkups as often as told by your health care provider.  Have Pap tests done as often as told by  your health care provider. A Pap test is sometimes called a Pap smear. It is a screening test that is used to check for signs of cancer of the cervix and vagina. A Pap test can also identify the presence of infection or precancerous changes. Pap tests may be done: ? Every 3 years, starting at age 52. ? Every 5 years, starting after age 52, in  combination with testing for human papillomavirus (HPV). ? More often or less often depending on other medical conditions you have, your age, and other risk factors.  It is up to you to get the results of your Pap test. Ask your health care provider, or the department that is doing the test, when your results will be ready.  Keep all follow-up visits as told by your health care provider. This is important. Contact a health care provider if you have:  Pain or swelling in your legs.  Shortness of breath.  Chest pain.  Lumps or changes in your breasts or armpits.  Slurred speech.  Pain, burning, or bleeding when you urinate.  Unusual vaginal bleeding.  Dizziness or headaches.  Weakness or numbness in any part of your arms or legs.  Pain in your abdomen. Summary  Menopause is a normal time of life when menstrual periods stop completely and the ovaries stop producing the female hormones estrogen and progesterone.  Hormone replacement therapy (HRT) can relieve some of the symptoms of menopause.  HRT can reduce the frequency and severity of menopausal symptoms.  Risks of HRT vary depending on your individual health and medical history. This information is not intended to replace advice given to you by your health care provider. Make sure you discuss any questions you have with your health care provider. Document Released: 01/14/2003 Document Revised: 12/18/2017 Document Reviewed: 12/18/2017 Elsevier Patient Education  2020 [employer]    Health Maintenance for Postmenopausal Women Menopause is a normal process in which your ability to get pregnant comes to an end. This process happens slowly over many months or years, usually between the ages of 1148 and 1955. Menopause is complete when you have missed your menstrual periods for 12 months. It is important to talk with your health care provider about some of the most common conditions that affect women after menopause  (postmenopausal women). These include heart disease, cancer, and bone loss (osteoporosis). Adopting a healthy lifestyle and getting preventive care can help to promote your health and wellness. The actions you take can also lower your chances of developing some of these common conditions. What should I know about menopause? During menopause, you may get a number of symptoms, such as:  Hot flashes. These can be moderate or severe.  Night sweats.  Decrease in sex drive.  Mood swings.  Headaches.  Tiredness.  Irritability.  Memory problems.  Insomnia. Choosing to treat or not to treat these symptoms is a decision that you make with your health care provider. Do I need hormone replacement therapy?  Hormone replacement therapy is effective in treating symptoms that are caused by menopause, such as hot flashes and night sweats.  Hormone replacement carries certain risks, especially as you become older. If you are thinking about using estrogen or estrogen with progestin, discuss the benefits and risks with your health care provider. What is my risk for heart disease and stroke? The risk of heart disease, heart attack, and stroke increases as you age. One of the causes may be a change in the body's hormones during menopause. This  can affect how your body uses dietary fats, triglycerides, and cholesterol. Heart attack and stroke are medical emergencies. There are many things that you can do to help prevent heart disease and stroke. Watch your blood pressure  High blood pressure causes heart disease and increases the risk of stroke. This is more likely to develop in people who have high blood pressure readings, are of African descent, or are overweight.  Have your blood pressure checked: ? Every 3-5 years if you are 4418-52 years of age. ? Every year if you are 52 years old or older. Eat a healthy diet   Eat a diet that includes plenty of vegetables, fruits, low-fat dairy products, and  lean protein.  Do not eat a lot of foods that are high in solid fats, added sugars, or sodium. Get regular exercise Get regular exercise. This is one of the most important things you can do for your health. Most adults should:  Try to exercise for at least 150 minutes each week. The exercise should increase your heart rate and make you sweat (moderate-intensity exercise).  Try to do strengthening exercises at least twice each week. Do these in addition to the moderate-intensity exercise.  Spend less time sitting. Even light physical activity can be beneficial. Other tips  Work with your health care provider to achieve or maintain a healthy weight.  Do not use any products that contain nicotine or tobacco, such as cigarettes, e-cigarettes, and chewing tobacco. If you need help quitting, ask your health care provider.  Know your numbers. Ask your health care provider to check your cholesterol and your blood sugar (glucose). Continue to have your blood tested as directed by your health care provider. Do I need screening for cancer? Depending on your health history and family history, you may need to have cancer screening at different stages of your life. This may include screening for:  Breast cancer.  Cervical cancer.  Lung cancer.  Colorectal cancer. What is my risk for osteoporosis? After menopause, you may be at increased risk for osteoporosis. Osteoporosis is a condition in which bone destruction happens more quickly than new bone creation. To help prevent osteoporosis or the bone fractures that can happen because of osteoporosis, you may take the following actions:  If you are 6819-52 years old, get at least 1,000 mg of calcium and at least 600 mg of vitamin D per day.  If you are older than age 52 but younger than age 52, get at least 1,200 mg of calcium and at least 600 mg of vitamin D per day.  If you are older than age 52, get at least 1,200 mg of calcium and at least 800 mg  of vitamin D per day. Smoking and drinking excessive alcohol increase the risk of osteoporosis. Eat foods that are rich in calcium and vitamin D, and do weight-bearing exercises several times each week as directed by your health care provider. How does menopause affect my mental health? Depression may occur at any age, but it is more common as you become older. Common symptoms of depression include:  Low or sad mood.  Changes in sleep patterns.  Changes in appetite or eating patterns.  Feeling an overall lack of motivation or enjoyment of activities that you previously enjoyed.  Frequent crying spells. Talk with your health care provider if you think that you are experiencing depression. General instructions See your health care provider for regular wellness exams and vaccines. This may include:  Scheduling regular health, dental,  and eye exams.  Getting and maintaining your vaccines. These include: ? Influenza vaccine. Get this vaccine each year before the flu season begins. ? Pneumonia vaccine. ? Shingles vaccine. ? Tetanus, diphtheria, and pertussis (Tdap) booster vaccine. Your health care provider may also recommend other immunizations. Tell your health care provider if you have ever been abused or do not feel safe at home. Summary  Menopause is a normal process in which your ability to get pregnant comes to an end.  This condition causes hot flashes, night sweats, decreased interest in sex, mood swings, headaches, or lack of sleep.  Treatment for this condition may include hormone replacement therapy.  Take actions to keep yourself healthy, including exercising regularly, eating a healthy diet, watching your weight, and checking your blood pressure and blood sugar levels.  Get screened for cancer and depression. Make sure that you are up to date with all your vaccines. This information is not intended to replace advice given to you by your health care provider. Make sure  you discuss any questions you have with your health care provider. Document Released: 06/09/2005 Document Revised: 04/10/2018 Document Reviewed: 04/10/2018 Elsevier Patient Education  2020 [employer].

## 2019-01-01 ENCOUNTER — Telehealth: Payer: Self-pay

## 2019-01-01 LAB — URINALYSIS, COMPLETE W/RFL CULTURE
Bacteria, UA: NONE SEEN /HPF
Bilirubin Urine: NEGATIVE
Glucose, UA: NEGATIVE
Hgb urine dipstick: NEGATIVE
Hyaline Cast: NONE SEEN /LPF
Ketones, ur: NEGATIVE
Leukocyte Esterase: NEGATIVE
Nitrites, Initial: NEGATIVE
Protein, ur: NEGATIVE
RBC / HPF: NONE SEEN /HPF (ref 0–2)
Specific Gravity, Urine: 1.011 (ref 1.001–1.03)
WBC, UA: NONE SEEN /HPF (ref 0–5)
pH: 7 (ref 5.0–8.0)

## 2019-01-01 LAB — PAP, TP IMAGING W/ HPV RNA, RFLX HPV TYPE 16,18/45: HPV DNA High Risk: NOT DETECTED

## 2019-01-01 LAB — NO CULTURE INDICATED

## 2019-01-01 MED ORDER — FLUCONAZOLE 150 MG PO TABS: 150.0000 mg | ORAL_TABLET | Freq: Once | ORAL | 1 refills | Status: AC

## 2019-01-01 NOTE — Telephone Encounter (Signed)
Note from pharmacy: "Patient requests new Rx:  Patient is looking for new Rx for Fluconazole. Thank you. "

## 2019-01-01 NOTE — Telephone Encounter (Signed)
Okay for Diflucan 150 mg 1 dose 

## 2019-01-01 NOTE — Telephone Encounter (Signed)
Rx sent 

## 2019-01-13 ENCOUNTER — Ambulatory Visit
Admission: RE | Admit: 2019-01-13 | Discharge: 2019-01-13 | Disposition: A | Discharge status: Home / Self Care | Payer: 59 | Source: Ambulatory Visit | Attending: Women's Health | Admitting: Women's Health

## 2019-01-13 ENCOUNTER — Other Ambulatory Visit: Payer: Self-pay

## 2019-01-13 DIAGNOSIS — Z1231 Encounter for screening mammogram for malignant neoplasm of breast: Secondary | ICD-10-CM

## 2019-01-14 ENCOUNTER — Other Ambulatory Visit: Payer: 59

## 2019-01-14 ENCOUNTER — Other Ambulatory Visit: Payer: Self-pay

## 2019-01-14 DIAGNOSIS — E559 Vitamin D deficiency, unspecified: Secondary | ICD-10-CM

## 2019-01-14 DIAGNOSIS — Z01419 Encounter for gynecological examination (general) (routine) without abnormal findings: Secondary | ICD-10-CM

## 2019-01-14 DIAGNOSIS — Z13220 Encounter for screening for lipoid disorders: Secondary | ICD-10-CM

## 2019-01-15 LAB — CBC WITH DIFFERENTIAL/PLATELET
Absolute Monocytes: 332 cells/uL (ref 200–950)
Basophils Absolute: 41 cells/uL (ref 0–200)
Basophils Relative: 0.8 %
Eosinophils Absolute: 102 cells/uL (ref 15–500)
Eosinophils Relative: 2 %
HCT: 44 % (ref 35.0–45.0)
Hemoglobin: 14.8 g/dL (ref 11.7–15.5)
Lymphs Abs: 1719 cells/uL (ref 850–3900)
MCH: 30.6 pg (ref 27.0–33.0)
MCHC: 33.6 g/dL (ref 32.0–36.0)
MCV: 91.1 fL (ref 80.0–100.0)
MPV: 10.5 fL (ref 7.5–12.5)
Monocytes Relative: 6.5 %
Neutro Abs: 2907 cells/uL (ref 1500–7800)
Neutrophils Relative %: 57 %
Platelets: 244 10*3/uL (ref 140–400)
RBC: 4.83 10*6/uL (ref 3.80–5.10)
RDW: 11.9 % (ref 11.0–15.0)
Total Lymphocyte: 33.7 %
WBC: 5.1 10*3/uL (ref 3.8–10.8)

## 2019-01-15 LAB — COMPREHENSIVE METABOLIC PANEL
AG Ratio: 1.8 (calc) (ref 1.0–2.5)
ALT: 13 U/L (ref 6–29)
AST: 14 U/L (ref 10–35)
Albumin: 4.2 g/dL (ref 3.6–5.1)
Alkaline phosphatase (APISO): 65 U/L (ref 37–153)
BUN: 13 mg/dL (ref 7–25)
CO2: 25 mmol/L (ref 20–32)
Calcium: 9.2 mg/dL (ref 8.6–10.4)
Chloride: 106 mmol/L (ref 98–110)
Creat: 0.9 mg/dL (ref 0.50–1.05)
Globulin: 2.3 g/dL (calc) (ref 1.9–3.7)
Glucose, Bld: 86 mg/dL (ref 65–99)
Potassium: 4 mmol/L (ref 3.5–5.3)
Sodium: 138 mmol/L (ref 135–146)
Total Bilirubin: 0.5 mg/dL (ref 0.2–1.2)
Total Protein: 6.5 g/dL (ref 6.1–8.1)

## 2019-01-15 LAB — LIPID PANEL
Cholesterol: 189 mg/dL (ref <200)
HDL: 41 mg/dL — ABNORMAL LOW (ref >=50)
LDL Cholesterol (Calc): 114 mg/dL (calc) — ABNORMAL HIGH
Non-HDL Cholesterol (Calc): 148 mg/dL (calc) — ABNORMAL HIGH (ref <130)
Total CHOL/HDL Ratio: 4.6 (calc) (ref <5.0)
Triglycerides: 225 mg/dL — ABNORMAL HIGH (ref <150)

## 2019-01-15 LAB — VITAMIN D 25 HYDROXY (VIT D DEFICIENCY, FRACTURES): Vit D, 25-Hydroxy: 36 ng/mL (ref 30–100)

## 2019-01-22 ENCOUNTER — Encounter: Payer: Self-pay | Admitting: Gynecology

## 2019-01-23 ENCOUNTER — Telehealth: Payer: Self-pay

## 2019-01-23 NOTE — Telephone Encounter (Signed)
Called patient and per DPR access note on file I left message and let her know My Chart email was returned unread. I read it to her in her voice mail and asked her to call or e-mail me back and let me know she received it.

## 2019-01-23 NOTE — Telephone Encounter (Signed)
I called patient and per DPR access note on file I left message and let patient know that we sent e-mail to her about test results. I left detailed message in voice mail and asked her to call or e-mail me and let me know she received the message.  "Melanie Wiggins reviewed your lab results and wrote "Triglycerides and LDL (bad cholesterol) elevated. May need cholesterol-lowering medication, best to follow-up with primary care physician. Vitamin D was 36. Need to be taking over-the-counter vitamin D3 2000 IUs daily. Blood sugar, electrolytes and CBC all normal."

## 2019-03-28 ENCOUNTER — Encounter: Payer: Self-pay | Admitting: Women's Health

## 2019-04-01 NOTE — Telephone Encounter (Signed)
TC being treated for UTI some relief has follow up and has stopped HRT but still having pelvic cramping. Will call if continues and sched [place]

## 2019-12-22 ENCOUNTER — Other Ambulatory Visit: Payer: Self-pay | Admitting: Women's Health

## 2019-12-22 DIAGNOSIS — Z1231 Encounter for screening mammogram for malignant neoplasm of breast: Secondary | ICD-10-CM

## 2019-12-24 ENCOUNTER — Other Ambulatory Visit: Payer: Self-pay

## 2019-12-24 MED ORDER — PROGESTERONE MICRONIZED 100 MG PO CAPS: 100.0000 mg | ORAL_CAPSULE | Freq: Every day | ORAL | 0 refills | Status: DC

## 2020-01-15 ENCOUNTER — Other Ambulatory Visit: Payer: Self-pay

## 2020-01-15 ENCOUNTER — Ambulatory Visit
Admission: RE | Admit: 2020-01-15 | Discharge: 2020-01-15 | Disposition: A | Discharge status: Home / Self Care | Payer: 59 | Source: Ambulatory Visit

## 2020-01-15 DIAGNOSIS — Z1231 Encounter for screening mammogram for malignant neoplasm of breast: Secondary | ICD-10-CM

## 2020-02-04 ENCOUNTER — Other Ambulatory Visit: Payer: Self-pay | Admitting: *Deleted

## 2020-02-29 ENCOUNTER — Other Ambulatory Visit: Payer: Self-pay | Admitting: Obstetrics & Gynecology

## 2020-03-15 ENCOUNTER — Telehealth: Payer: Self-pay | Admitting: *Deleted

## 2020-03-15 MED ORDER — PROGESTERONE MICRONIZED 100 MG PO CAPS: 100.0000 mg | ORAL_CAPSULE | Freq: Every day | ORAL | 0 refills | Status: AC

## 2020-03-15 NOTE — Telephone Encounter (Signed)
Patient called left message in triage voicemail requesting a 30 day supply on progesterone 100 mg capsule, annual exam overdue. Patient said she was not aware Maryelizabeth Rowan, NP had retired and she will seeking a new provider, however requesting 1 month supply of progesterone.

## 2020-03-16 MED ORDER — ESTRADIOL 0.05 MG/24HR TD PTTW: 1.0000 | MEDICATED_PATCH | TRANSDERMAL | 0 refills | Status: AC

## 2020-03-16 NOTE — Telephone Encounter (Signed)
Patient called back asking if she could also have 1 month supply for vivelle dot patches as well.Rx sent

## 2020-04-07 ENCOUNTER — Other Ambulatory Visit: Payer: Self-pay | Admitting: Nurse Practitioner

## 2020-04-08 ENCOUNTER — Other Ambulatory Visit: Payer: Self-pay | Admitting: Nurse Practitioner

## 2020-07-20 ENCOUNTER — Other Ambulatory Visit: Payer: Self-pay | Admitting: Obstetrics & Gynecology

## 2020-07-20 DIAGNOSIS — N644 Mastodynia: Secondary | ICD-10-CM

## 2020-08-18 ENCOUNTER — Other Ambulatory Visit: Payer: 59

## 2022-05-19 IMAGING — MG DIGITAL SCREENING BILAT W/ TOMO W/ CAD
8 series · 8 of 24 positions shown · non-contrast
Comparison: Previous exam(s).

CLINICAL DATA: Screening.

EXAM:
DIGITAL SCREENING BILATERAL MAMMOGRAM WITH TOMO AND CAD

[L MLO synth-2D]
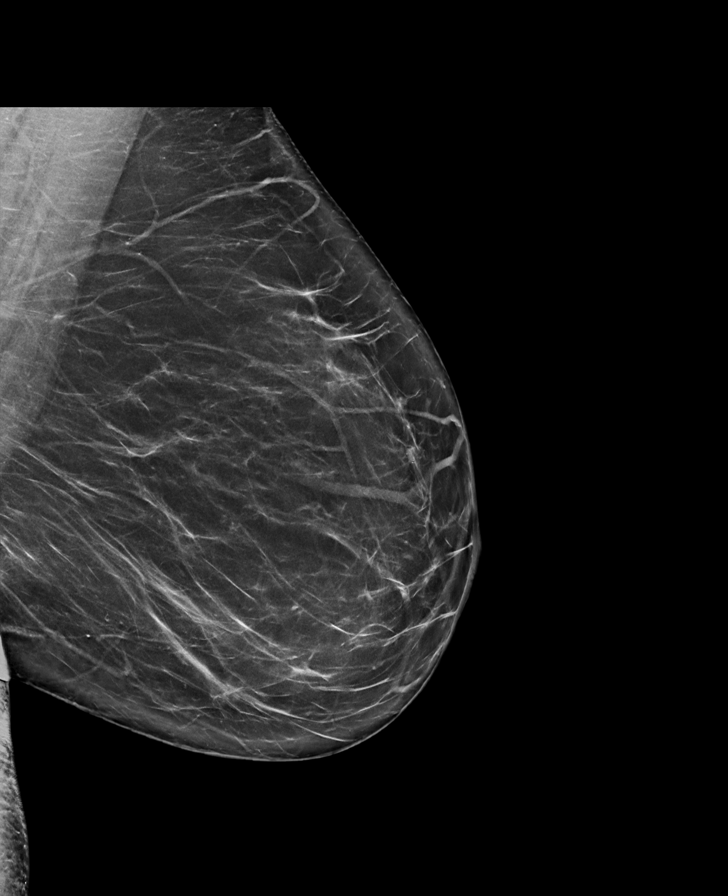

[R MLO synth-2D]
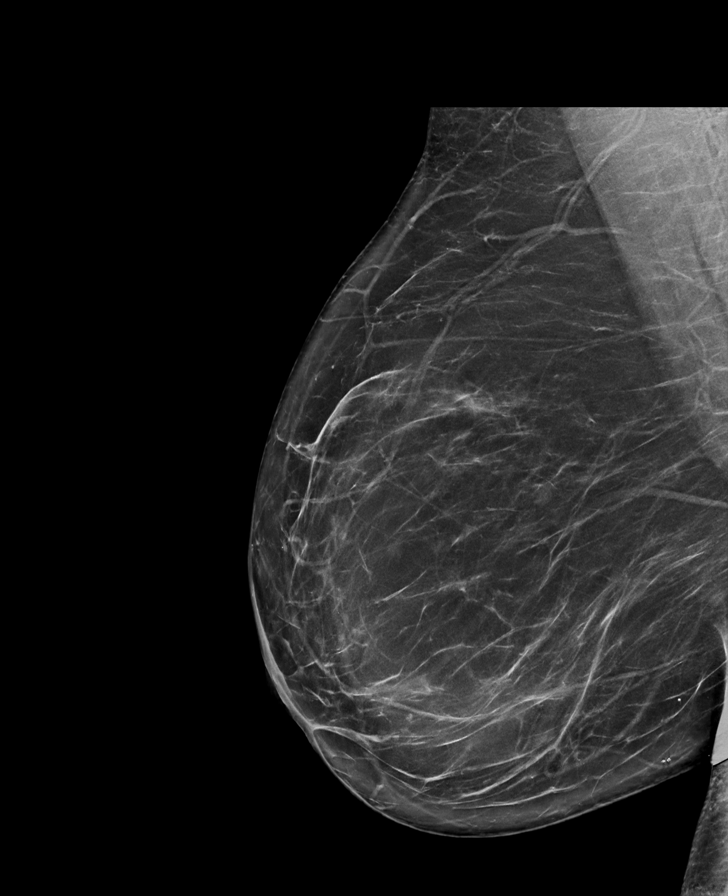

[R CC synth-2D]
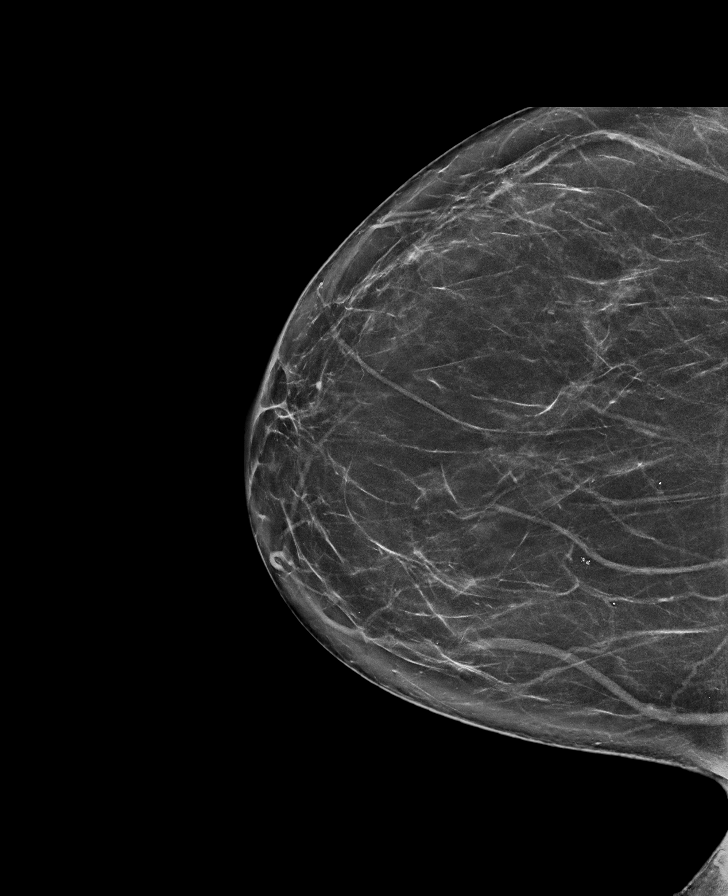

[L CC synth-2D]
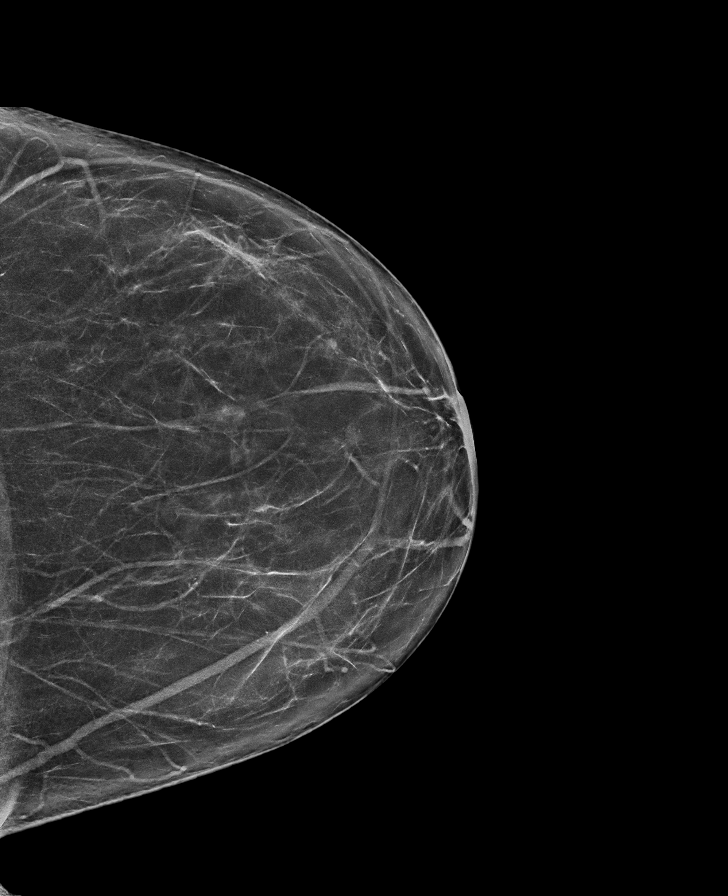

[L CC tomo · tomo slice 38/75.0]
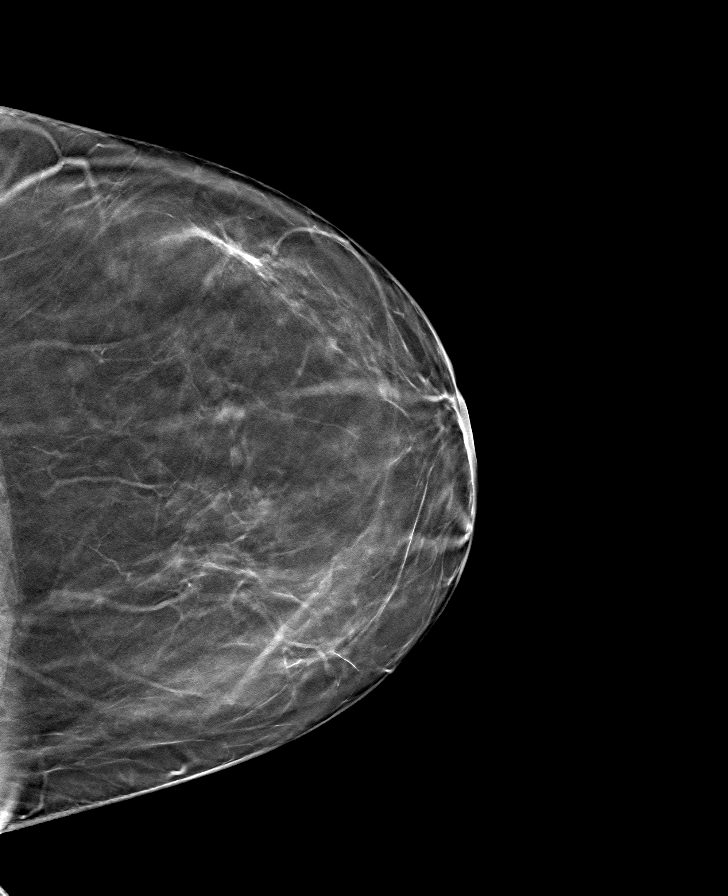

[L MLO tomo · tomo slice 41/82.0]
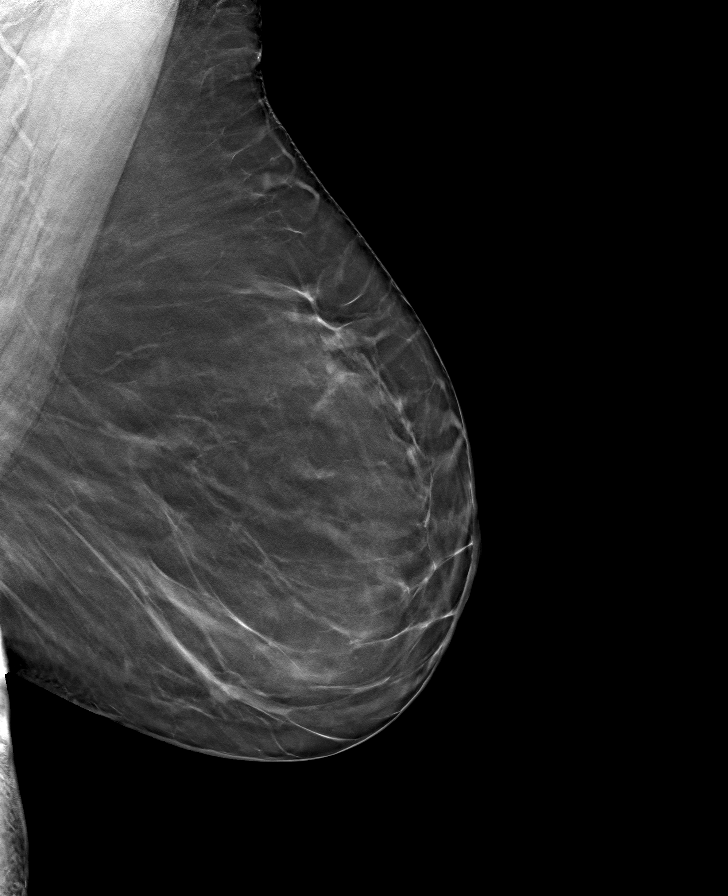

[R CC tomo · tomo slice 39/78.0]
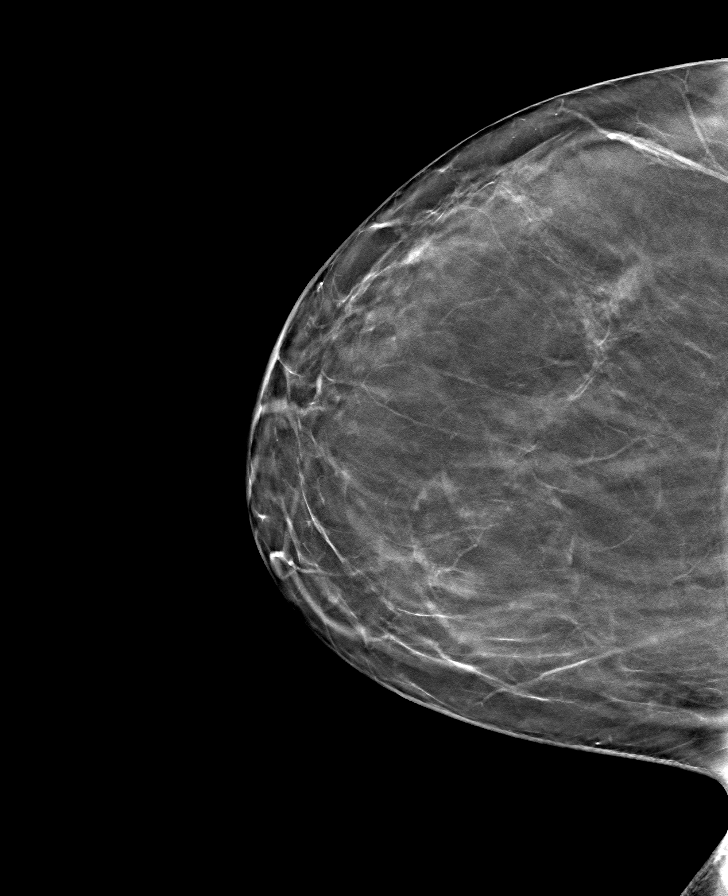

[R MLO tomo · tomo slice 43/86.0]
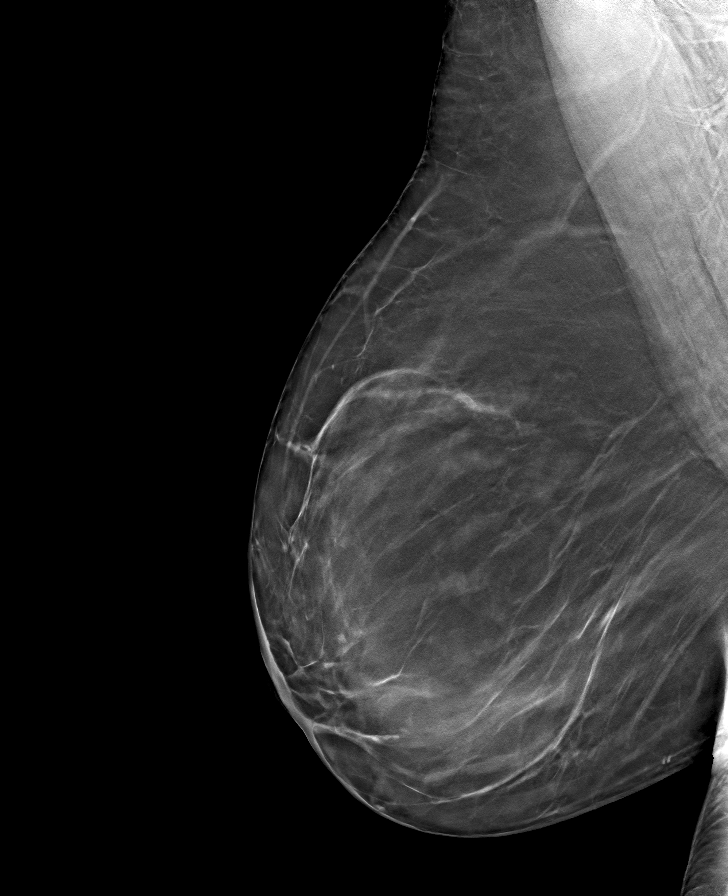

[8 of 24 positions shown; findings below may reference images not displayed]

ACR Breast Density Category b: There are scattered areas of
fibroglandular density.
FINDINGS: There are no findings suspicious for malignancy. Images were
processed with CAD.
IMPRESSION: No mammographic evidence of malignancy. A result letter of this
screening mammogram will be mailed directly to the patient.

RECOMMENDATION:
Screening mammogram in one year. (Code:[id number])

BI-RADS CATEGORY  1: Negative.
